# Patient Record
Sex: Female | Born: 1950
Health system: Southern US, Community
[De-identification: ages and names within clinical notes are randomized; demographics above are authoritative.]

## PROBLEM LIST (undated history)

## (undated) DIAGNOSIS — N390 Urinary tract infection, site not specified: Secondary | ICD-10-CM

## (undated) DIAGNOSIS — K219 Gastro-esophageal reflux disease without esophagitis: Secondary | ICD-10-CM

## (undated) DIAGNOSIS — I10 Essential (primary) hypertension: Secondary | ICD-10-CM

## (undated) DIAGNOSIS — A499 Bacterial infection, unspecified: Secondary | ICD-10-CM

## (undated) DIAGNOSIS — B019 Varicella without complication: Secondary | ICD-10-CM

## (undated) DIAGNOSIS — K635 Polyp of colon: Secondary | ICD-10-CM

## (undated) HISTORY — DX: Bacterial infection, unspecified: A49.9

## (undated) HISTORY — PX: TUBAL LIGATION: SHX77

## (undated) HISTORY — PX: CYST REMOVAL NECK: SHX6281

## (undated) HISTORY — DX: Polyp of colon: K63.5

## (undated) HISTORY — DX: Varicella without complication: B01.9

## (undated) HISTORY — DX: Gastro-esophageal reflux disease without esophagitis: K21.9

## (undated) HISTORY — PX: TONSILLECTOMY AND ADENOIDECTOMY: SUR1326

## (undated) HISTORY — PX: CARDIAC CATHETERIZATION: SHX172

## (undated) HISTORY — DX: Bacterial infection, unspecified: N39.0

---

## 1983-06-07 HISTORY — PX: CHOLECYSTECTOMY: SHX55

## 2002-04-09 LAB — HM COLONOSCOPY

## 2004-03-08 ENCOUNTER — Ambulatory Visit: Payer: Self-pay | Admitting: Unknown Physician Specialty

## 2005-03-30 ENCOUNTER — Ambulatory Visit: Payer: Self-pay | Admitting: Unknown Physician Specialty

## 2007-03-15 ENCOUNTER — Ambulatory Visit: Payer: Self-pay | Admitting: Unknown Physician Specialty

## 2008-04-29 ENCOUNTER — Ambulatory Visit: Payer: Self-pay | Admitting: Internal Medicine

## 2008-05-06 ENCOUNTER — Ambulatory Visit: Payer: Self-pay | Admitting: Internal Medicine

## 2008-05-15 ENCOUNTER — Ambulatory Visit: Payer: Self-pay | Admitting: Internal Medicine

## 2008-06-06 ENCOUNTER — Ambulatory Visit: Payer: Self-pay | Admitting: Internal Medicine

## 2009-04-09 LAB — HM PAP SMEAR: HM Pap smear: NORMAL

## 2011-11-10 ENCOUNTER — Ambulatory Visit: Payer: Self-pay | Admitting: Unknown Physician Specialty

## 2011-12-08 LAB — HM MAMMOGRAPHY: HM Mammogram: NORMAL

## 2012-01-07 ENCOUNTER — Other Ambulatory Visit: Payer: Self-pay | Admitting: Internal Medicine

## 2012-04-09 ENCOUNTER — Ambulatory Visit (INDEPENDENT_AMBULATORY_CARE_PROVIDER_SITE_OTHER): Payer: PRIVATE HEALTH INSURANCE | Admitting: Internal Medicine

## 2012-04-09 ENCOUNTER — Encounter: Payer: Self-pay | Admitting: Internal Medicine

## 2012-04-09 VITALS — BP 144/88 | HR 75 | Temp 98.0°F | Ht 69.0 in | Wt 202.2 lb

## 2012-04-09 DIAGNOSIS — Z0000 Encounter for general adult medical examination without abnormal findings: Secondary | ICD-10-CM

## 2012-04-09 DIAGNOSIS — D649 Anemia, unspecified: Secondary | ICD-10-CM

## 2012-04-09 LAB — CBC WITH DIFFERENTIAL/PLATELET
Basophils Absolute: 0 10*3/uL (ref 0.0–0.1)
Eosinophils Absolute: 0.2 10*3/uL (ref 0.0–0.7)
HCT: 34.1 % — ABNORMAL LOW (ref 36.0–46.0)
Hemoglobin: 10.9 g/dL — ABNORMAL LOW (ref 12.0–15.0)
Lymphs Abs: 1.9 10*3/uL (ref 0.7–4.0)
MCHC: 31.9 g/dL (ref 30.0–36.0)
MCV: 83.7 fl (ref 78.0–100.0)
Monocytes Relative: 10.4 % (ref 3.0–12.0)
Neutro Abs: 1.6 10*3/uL (ref 1.4–7.7)
RDW: 13.4 % (ref 11.5–14.6)

## 2012-04-09 LAB — COMPREHENSIVE METABOLIC PANEL
ALT: 25 U/L (ref 0–35)
AST: 27 U/L (ref 0–37)
Alkaline Phosphatase: 79 U/L (ref 39–117)
Sodium: 138 mEq/L (ref 135–145)
Total Bilirubin: 0.5 mg/dL (ref 0.3–1.2)
Total Protein: 8.5 g/dL — ABNORMAL HIGH (ref 6.0–8.3)

## 2012-04-09 LAB — LDL CHOLESTEROL, DIRECT: Direct LDL: 132.2 mg/dL

## 2012-04-09 LAB — LIPID PANEL: HDL: 68.1 mg/dL (ref >39.00)

## 2012-04-09 NOTE — Progress Notes (Signed)
  Subjective:    Patient ID: Cassie Torres, female    DOB: [birth year], 61 y.o.   MRN: [id number]  HPI 61 year old female presents to establish care. She reports that she is generally doing well. She has not seen a physician in approximately 2 years. Her husband passed away unexpectedly 2 years ago and she notes that she has been lost to followup that time. She has not had any major chronic medical problems. She reports normal energy level. She denies any concerns about bowel habits except for occasional constipation for which she takes MiraLax. She denies blood in her stool or black stool. She had a colonoscopy approximately 10 years ago which she reports was normal. She is due for her mammogram. Pap smear is UTD.  No outpatient encounter prescriptions on file as of 04/09/2012.   BP 144/88  Pulse 75  Temp 98 F (36.7 C)  Ht 5\' 9"  (1.753 m)  Wt 202 lb 4 oz (91.74 kg)  BMI 29.87 kg/m2  SpO2 98%  Review of Systems  Constitutional: Negative for fever, chills, appetite change, fatigue and unexpected weight change.  HENT: Negative for ear pain, congestion, sore throat, trouble swallowing, neck pain, voice change and sinus pressure.   Eyes: Negative for visual disturbance.  Respiratory: Negative for cough, shortness of breath, wheezing and stridor.   Cardiovascular: Negative for chest pain, palpitations and leg swelling.  Gastrointestinal: Negative for nausea, vomiting, abdominal pain, diarrhea, constipation, blood in stool, abdominal distention and anal bleeding.  Genitourinary: Negative for dysuria and flank pain.  Musculoskeletal: Negative for myalgias, arthralgias and gait problem.  Skin: Negative for color change and rash.  Neurological: Negative for dizziness and headaches.  Hematological: Negative for adenopathy. Does not bruise/bleed easily.  Psychiatric/Behavioral: Negative for suicidal ideas, sleep disturbance and dysphoric mood. The patient is not nervous/anxious.          Objective:   Physical Exam  Constitutional: She is oriented to person, place, and time. She appears well-developed and well-nourished. No distress.  HENT:  Head: Normocephalic and atraumatic.  Right Ear: External ear normal.  Left Ear: External ear normal.  Nose: Nose normal.  Mouth/Throat: Oropharynx is clear and moist. No oropharyngeal exudate.  Eyes: Conjunctivae normal are normal. Pupils are equal, round, and reactive to light. Right eye exhibits no discharge. Left eye exhibits no discharge. No scleral icterus.  Neck: Normal range of motion. Neck supple. No tracheal deviation present. No thyromegaly present.  Cardiovascular: Normal rate, regular rhythm, normal heart sounds and intact distal pulses.  Exam reveals no gallop and no friction rub.   No murmur heard. Pulmonary/Chest: Effort normal and breath sounds normal. No respiratory distress. She has no wheezes. She has no rales. She exhibits no tenderness.  Abdominal: Soft. Bowel sounds are normal. She exhibits no distension and no mass. There is no tenderness. There is no rebound and no guarding.  Musculoskeletal: Normal range of motion. She exhibits no edema and no tenderness.  Lymphadenopathy:    She has no cervical adenopathy.  Neurological: She is alert and oriented to person, place, and time. No cranial nerve deficit. She exhibits normal muscle tone. Coordination normal.  Skin: Skin is warm and dry. No rash noted. She is not diaphoretic. No erythema. No pallor.  Psychiatric: She has a normal mood and affect. Her behavior is normal. Judgment and thought content normal.          Assessment & Plan:

## 2012-04-09 NOTE — Assessment & Plan Note (Signed)
General medical exam is normal today. Will obtain records on previous evaluation and management. Labs remarkable for mild anemia as noted below. Will obtain records on prior colonoscopy. Followup in one month.

## 2012-04-09 NOTE — Assessment & Plan Note (Signed)
Mild microcytic anemia noted on labs today. Leukocytes and platelets are also slightly low. Will try to obtain records on previous CBC. Will send ferritin to look for iron deficiency. If this is low, will check stool Hemoccult for blood. We'll plan to repeat CBC in one month. Followup one month.

## 2012-07-18 ENCOUNTER — Telehealth: Payer: Self-pay | Admitting: Internal Medicine

## 2012-07-18 ENCOUNTER — Encounter: Payer: Self-pay | Admitting: Adult Health

## 2012-07-18 ENCOUNTER — Ambulatory Visit (INDEPENDENT_AMBULATORY_CARE_PROVIDER_SITE_OTHER): Payer: PRIVATE HEALTH INSURANCE | Admitting: Adult Health

## 2012-07-18 VITALS — BP 110/80 | HR 80 | Temp 98.2°F | Resp 14 | Ht 69.0 in | Wt 203.0 lb

## 2012-07-18 DIAGNOSIS — L02429 Furuncle of limb, unspecified: Secondary | ICD-10-CM

## 2012-07-18 MED ORDER — DOXYCYCLINE HYCLATE 100 MG PO TABS: 100.0000 mg | ORAL_TABLET | Freq: Two times a day (BID) | ORAL | Status: DC

## 2012-07-18 NOTE — Assessment & Plan Note (Addendum)
I&D performed after area was numbed with xylocane 2%. There was very little drainage from area. Culture sent. Patient was started on doxycycline x 10 days. Return for f/u on Friday.

## 2012-07-18 NOTE — Patient Instructions (Addendum)
  Please start your antibiotic today. You will take this twice daily for 10 days.  Please call if you develop fever or chills.  Keep the area clean. You can cover the opening of the boil with gauze to absorb any drainage.  Take tylenol or ibuprofen if you have any pain.  Follow up in the office on Friday.

## 2012-07-18 NOTE — Telephone Encounter (Signed)
Patient Information:  Caller Name: Stepanie  Phone: [phone]  Patient: Cassie Torres, Cassie Torres  Gender: Female  DOB: 1950/06/26  Age: 62 Years  PCP: Orville Govern  Office Follow Up:  Does the office need to follow up with this patient?: No  Instructions For The Office: N/A   Symptoms  Reason For Call & Symptoms: Boil on Left inner thigh since Sun 2/9.  Size of silver dollar, redness growing rapidly, area tender  Reviewed Health History In EMR: Yes  Reviewed Medications In EMR: Yes  Reviewed Allergies In EMR: Yes  Reviewed Surgeries / Procedures: Yes  Date of Onset of Symptoms: 07/15/2012  Treatments Tried: hot soaks, salt water, Boileze  Treatments Tried Worked: No  Guideline(s) Used:  Skin Lesion - Moles or Growths  Disposition Per Guideline:   See Today in Office  Reason For Disposition Reached:   Looks infected (e.g., spreading redness, pus, red streak)  Advice Given:  Call Back If:  Fever or pain occurs  You become worse.  Appointment Scheduled:  07/18/2012 11:00:00 Appointment Scheduled Provider:  Orville Govern

## 2012-07-18 NOTE — Telephone Encounter (Signed)
Just an FYI

## 2012-07-18 NOTE — Progress Notes (Signed)
  Subjective:    Patient ID: Cassie Torres, female    DOB: 1950/08/27, 62 y.o.   MRN: [id number]  HPI  Patient presents with c/o having a "boil" on the inner aspect of her left thigh. Patient reports that initially there were 3 different areas but the others have resolved. She noticed the area on Sunday while bathing. It is red, swollen and painful to touch. She denies drainage, fever or chills.  Review of Systems  Constitutional: Negative for fever and chills.  Skin:       Boil inner left upper thigh. Area is red and painful to touch.    BP 110/80  Pulse 80  Temp(Src) 98.2 F (36.8 C) (Oral)  Resp 14  Ht 5\' 9"  (1.753 m)  Wt 203 lb (92.08 kg)  BMI 29.96 kg/m2  SpO2 99%     Objective:   Physical Exam  Constitutional: She is oriented to person, place, and time. She appears well-developed and well-nourished. No distress.  Neurological: She is alert and oriented to person, place, and time.  Skin: No rash noted. There is erythema.  localized infection in the skin on upper inner aspect of left thigh. Area is firm, hard, and tender. Center forms a head with escar noted. No drainage.         Assessment & Plan:

## 2012-07-20 ENCOUNTER — Ambulatory Visit: Payer: PRIVATE HEALTH INSURANCE | Admitting: Internal Medicine

## 2012-07-21 ENCOUNTER — Other Ambulatory Visit: Payer: Self-pay

## 2012-07-22 LAB — MRSA CULTURE

## 2012-07-26 ENCOUNTER — Ambulatory Visit (INDEPENDENT_AMBULATORY_CARE_PROVIDER_SITE_OTHER): Payer: PRIVATE HEALTH INSURANCE | Admitting: Internal Medicine

## 2012-07-26 ENCOUNTER — Encounter: Payer: Self-pay | Admitting: Internal Medicine

## 2012-07-26 VITALS — BP 138/78 | HR 70 | Temp 98.1°F | Ht 67.75 in | Wt 199.0 lb

## 2012-07-26 DIAGNOSIS — L02429 Furuncle of limb, unspecified: Secondary | ICD-10-CM

## 2012-07-26 DIAGNOSIS — E8809 Other disorders of plasma-protein metabolism, not elsewhere classified: Secondary | ICD-10-CM

## 2012-07-26 DIAGNOSIS — D649 Anemia, unspecified: Secondary | ICD-10-CM

## 2012-07-26 LAB — COMPREHENSIVE METABOLIC PANEL
Albumin: 3.6 g/dL (ref 3.5–5.2)
BUN: 14 mg/dL (ref 6–23)
CO2: 29 mEq/L (ref 19–32)
Calcium: 9.1 mg/dL (ref 8.4–10.5)
Chloride: 104 mEq/L (ref 96–112)
GFR: 111.45 mL/min (ref >60.00)
Glucose, Bld: 73 mg/dL (ref 70–99)
Potassium: 3.7 mEq/L (ref 3.5–5.1)
Sodium: 138 mEq/L (ref 135–145)
Total Protein: 8.5 g/dL — ABNORMAL HIGH (ref 6.0–8.3)

## 2012-07-26 LAB — CBC WITH DIFFERENTIAL/PLATELET
Basophils Relative: 0.8 % (ref 0.0–3.0)
Eosinophils Relative: 6.5 % — ABNORMAL HIGH (ref 0.0–5.0)
Lymphocytes Relative: 47.7 % — ABNORMAL HIGH (ref 12.0–46.0)
MCV: 82.1 fl (ref 78.0–100.0)
Monocytes Absolute: 0.4 10*3/uL (ref 0.1–1.0)
Neutrophils Relative %: 33.5 % — ABNORMAL LOW (ref 43.0–77.0)
Platelets: 216 10*3/uL (ref 150.0–400.0)
RBC: 4.05 Mil/uL (ref 3.87–5.11)
WBC: 3.7 10*3/uL — ABNORMAL LOW (ref 4.5–10.5)

## 2012-07-26 NOTE — Assessment & Plan Note (Signed)
Total protein noted to be elevated on recent labs. Will recheck today. If persistent elevation, would favor sending SIEP and UIEP.

## 2012-07-26 NOTE — Assessment & Plan Note (Signed)
History of microcytic anemia. Will repeat CBC with labs today.

## 2012-07-26 NOTE — Addendum Note (Signed)
Addended by: Ronna Polio A on: 07/26/2012 05:13 PM   Modules accepted: Orders

## 2012-07-26 NOTE — Progress Notes (Signed)
  Subjective:    Patient ID: Cassie Torres, female    DOB: 20-Dec-1950, 62 y.o.   MRN: [id number]  HPI 62 year old female presents for followup after recent episode of abscess on her left thigh. She is status post incision and drainage and treatment with doxycycline. She reports that symptoms have improved. She notes some drainage from the wound which has gradually decreased. She denies any fever or chills. She denies any new concerns today.  Outpatient Encounter Prescriptions as of 07/26/2012  Medication Sig Dispense Refill  . doxycycline (VIBRA-TABS) 100 MG tablet Take 1 tablet (100 mg total) by mouth 2 (two) times daily.  20 tablet  0   No facility-administered encounter medications on file as of 07/26/2012.   BP 138/78  Pulse 70  Temp(Src) 98.1 F (36.7 C) (Oral)  Ht 5' 7.75" (1.721 m)  Wt 199 lb (90.266 kg)  BMI 30.48 kg/m2  SpO2 98%   Review of Systems  Constitutional: Negative for fever, chills, appetite change, fatigue and unexpected weight change.  HENT: Negative for ear pain, congestion, sore throat, trouble swallowing, neck pain, voice change and sinus pressure.   Eyes: Negative for visual disturbance.  Respiratory: Negative for cough, shortness of breath, wheezing and stridor.   Cardiovascular: Negative for chest pain, palpitations and leg swelling.  Gastrointestinal: Negative for nausea, vomiting, abdominal pain, diarrhea, constipation, blood in stool, abdominal distention and anal bleeding.  Genitourinary: Negative for dysuria and flank pain.  Musculoskeletal: Negative for myalgias, arthralgias and gait problem.  Skin: Positive for color change and wound. Negative for rash.  Neurological: Negative for dizziness and headaches.  Hematological: Negative for adenopathy. Does not bruise/bleed easily.  Psychiatric/Behavioral: Negative for suicidal ideas, sleep disturbance and dysphoric mood. The patient is not nervous/anxious.        Objective:   Physical Exam    Constitutional: She is oriented to person, place, and time. She appears well-developed and well-nourished. No distress.  HENT:  Head: Normocephalic and atraumatic.  Right Ear: External ear normal.  Left Ear: External ear normal.  Nose: Nose normal.  Mouth/Throat: Oropharynx is clear and moist. No oropharyngeal exudate.  Eyes: Conjunctivae are normal. Pupils are equal, round, and reactive to light. Right eye exhibits no discharge. Left eye exhibits no discharge. No scleral icterus.  Neck: Normal range of motion. Neck supple. No tracheal deviation present. No thyromegaly present.  Cardiovascular: Normal rate, regular rhythm, normal heart sounds and intact distal pulses.  Exam reveals no gallop and no friction rub.   No murmur heard. Pulmonary/Chest: Effort normal and breath sounds normal. No respiratory distress. She has no wheezes. She has no rales. She exhibits no tenderness.  Musculoskeletal: Normal range of motion. She exhibits no edema and no tenderness.  Lymphadenopathy:    She has no cervical adenopathy.  Neurological: She is alert and oriented to person, place, and time. No cranial nerve deficit. She exhibits normal muscle tone. Coordination normal.  Skin: Skin is warm and dry. No rash noted. She is not diaphoretic. No erythema. No pallor.     Psychiatric: She has a normal mood and affect. Her behavior is normal. Judgment and thought content normal.          Assessment & Plan:

## 2012-07-26 NOTE — Assessment & Plan Note (Signed)
Abscess has nearly completely resolved after incision and drainage and oral doxycycline. Will continue to monitor. Pt will call if any recurrent redness, swelling, or if any fever/chills. Reviewed culture which showed staph aureus which was susceptible to methicillin.  Follow up prn.

## 2012-07-27 ENCOUNTER — Other Ambulatory Visit: Payer: PRIVATE HEALTH INSURANCE

## 2012-08-01 ENCOUNTER — Other Ambulatory Visit: Payer: PRIVATE HEALTH INSURANCE

## 2012-08-14 ENCOUNTER — Telehealth: Payer: Self-pay | Admitting: Internal Medicine

## 2012-08-14 ENCOUNTER — Other Ambulatory Visit (INDEPENDENT_AMBULATORY_CARE_PROVIDER_SITE_OTHER): Payer: PRIVATE HEALTH INSURANCE

## 2012-08-14 DIAGNOSIS — E8809 Other disorders of plasma-protein metabolism, not elsewhere classified: Secondary | ICD-10-CM

## 2012-08-14 DIAGNOSIS — D649 Anemia, unspecified: Secondary | ICD-10-CM

## 2012-08-14 NOTE — Telephone Encounter (Signed)
Please leave msg.  Pt wants to come in this afternoon after 2:00 p.m. To have blood work done for her appt on Thursday.

## 2012-08-14 NOTE — Telephone Encounter (Signed)
Left information on patient voicemail per her request, also asked her to call back to let us know what time she want to come in.

## 2012-08-14 NOTE — Telephone Encounter (Signed)
Will this be ok? Has her labs already been ordered?

## 2012-08-14 NOTE — Telephone Encounter (Signed)
Yes, labs have been ordered

## 2012-08-16 ENCOUNTER — Telehealth: Payer: Self-pay | Admitting: Internal Medicine

## 2012-08-16 ENCOUNTER — Encounter: Payer: Self-pay | Admitting: Internal Medicine

## 2012-08-16 ENCOUNTER — Ambulatory Visit (INDEPENDENT_AMBULATORY_CARE_PROVIDER_SITE_OTHER): Payer: PRIVATE HEALTH INSURANCE | Admitting: Internal Medicine

## 2012-08-16 VITALS — BP 150/90 | HR 73 | Temp 98.1°F | Wt 202.0 lb

## 2012-08-16 DIAGNOSIS — R51 Headache: Secondary | ICD-10-CM | POA: Insufficient documentation

## 2012-08-16 DIAGNOSIS — Z1239 Encounter for other screening for malignant neoplasm of breast: Secondary | ICD-10-CM

## 2012-08-16 DIAGNOSIS — E8809 Other disorders of plasma-protein metabolism, not elsewhere classified: Secondary | ICD-10-CM

## 2012-08-16 DIAGNOSIS — R519 Headache, unspecified: Secondary | ICD-10-CM | POA: Insufficient documentation

## 2012-08-16 DIAGNOSIS — D649 Anemia, unspecified: Secondary | ICD-10-CM

## 2012-08-16 LAB — PROTEIN ELECTROPHORESIS, SERUM
Albumin ELP: 49.9 % — ABNORMAL LOW (ref 55.8–66.1)
Alpha-1-Globulin: 3.8 % (ref 2.9–4.9)
Beta 2: 4.9 % (ref 3.2–6.5)
Beta Globulin: 5 % (ref 4.7–7.2)
Total Protein, Serum Electrophoresis: 8.4 g/dL — ABNORMAL HIGH (ref 6.0–8.3)

## 2012-08-16 NOTE — Assessment & Plan Note (Signed)
Symptoms of headache most consistent with tension headache, possibly exacerbated by recent sinus congestion. Will use ibuprofen and tylenol prn. Discussed trying Fiorcet, however pt prefers to hold off for now. Follow up prn.

## 2012-08-16 NOTE — Telephone Encounter (Signed)
I received notes from [facility] from Dr. Sherrlyn Hock. When pt was seen by Dr. Sherrlyn Hock before, she did not have anemia. Given that this is a new finding, in addition to the elevated protein, I would like for her to follow up with him. He may want to run a few more tests.

## 2012-08-16 NOTE — Assessment & Plan Note (Signed)
Elevated Total protein 8.5.  SPEP was normal with no M-Spike. Pt reports she was evaluated for this in past by oncology.  Will request records on evaluation. Plan to repeat CMP with labs with physical in 02/2013.

## 2012-08-16 NOTE — Assessment & Plan Note (Signed)
Stable anemia with hgb 10.7. Pt reports previous evaluation by hematology was unremarkable. Will request results on previous evaluation. Plan to repeat CBC with labs 02/2013.

## 2012-08-16 NOTE — Progress Notes (Signed)
Subjective:    Patient ID: Cassie Torres, female    DOB: 1951/02/03, 62 y.o.   MRN: [id number]  HPI 62 year old female presents for followup after recent cellulitis and abscess on her thighs. She reports that symptoms of cellulitis has completely resolved. She denies any recent fever or chills. She denies any other skin lesions.  Over the last couple of days she has had mild headache described as aching on the right side of her head behind her eye. There is no associated no photophobia. No nausea or vomiting. Patient reports these kind of headache typical for her. She generally takes Tylenol or ibuprofen with minimal improvement. Headache typically resolve after a couple of days.  She also recently had labs done which showed elevated blood protein levels. 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 She reports she was evaluated for this by hematology in the past. She reports she was told this was a benign condition and no followup was scheduled.  Outpatient Encounter Prescriptions as of 08/16/2012  Medication Sig Dispense Refill  . doxycycline (VIBRA-TABS) 100 MG tablet Take 1 tablet (100 mg total) by mouth 2 (two) times daily.  20 tablet  0   No facility-administered encounter medications on file as of 08/16/2012.   BP 150/90  Pulse 73  Temp(Src) 98.1 F (36.7 C) (Oral)  Wt 202 lb (91.627 kg)  BMI 30.94 kg/m2  SpO2 99%  Review of Systems  Constitutional: Negative for fever, chills, appetite change, fatigue and unexpected weight change.  HENT: Negative for ear pain, congestion, sore throat, trouble swallowing, neck pain, voice change and sinus pressure.   Eyes: Negative for visual disturbance.  Respiratory: Negative for cough, shortness of breath, wheezing and stridor.   Cardiovascular: Negative for chest pain, palpitations and leg swelling.  Gastrointestinal: Negative for nausea, vomiting, abdominal pain, diarrhea, constipation, blood in stool, abdominal distention and anal  bleeding.  Genitourinary: Negative for dysuria and flank pain.  Musculoskeletal: Negative for myalgias, arthralgias and gait problem.  Skin: Negative for color change and rash.  Neurological: Positive for headaches. Negative for dizziness.  Hematological: Negative for adenopathy. Does not bruise/bleed easily.  Psychiatric/Behavioral: Negative for suicidal ideas, sleep disturbance and dysphoric mood. The patient is not nervous/anxious.        Objective:   Physical Exam  Constitutional: She is oriented to person, place, and time. She appears well-developed and well-nourished. No distress.  HENT:  Head: Normocephalic and atraumatic.  Right Ear: External ear normal.  Left Ear: External ear normal.  Nose: Nose normal.  Mouth/Throat: Oropharynx is clear and moist. No oropharyngeal exudate.  Eyes: Conjunctivae are normal. Pupils are equal, round, and reactive to light. Right eye exhibits no discharge. Left eye exhibits no discharge. No scleral icterus.  Neck: Normal range of motion. Neck supple. No tracheal deviation present. No thyromegaly present.  Cardiovascular: Normal rate, regular rhythm, normal heart sounds and intact distal pulses.  Exam reveals no gallop and no friction rub.   No murmur heard. Pulmonary/Chest: Effort normal and breath sounds normal. No respiratory distress. She has no wheezes. She has no rales. She exhibits no tenderness.  Musculoskeletal: Normal range of motion. She exhibits no edema and no tenderness.  Lymphadenopathy:    She has no cervical adenopathy.  Neurological: She is alert and oriented to person, place, and time. No cranial nerve deficit. She exhibits normal muscle tone. Coordination normal.  Skin: Skin is warm and dry. No rash noted. She is not diaphoretic. No erythema. No pallor.  Psychiatric: She has a normal  mood and affect. Her behavior is normal. Judgment and thought content normal.          Assessment & Plan:

## 2012-08-17 ENCOUNTER — Telehealth: Payer: Self-pay | Admitting: Emergency Medicine

## 2012-08-17 LAB — PROTEIN ELECTROPHORESIS, URINE REFLEX
Albumin: 50.1 %
Alpha-1-Globulin, U: 20.7 %
Beta Globulin, U: 6.1 %

## 2012-08-17 NOTE — Telephone Encounter (Signed)
Patient informed and she will make an appointment with Dr. Sherrlyn Hock.

## 2012-08-20 ENCOUNTER — Ambulatory Visit: Payer: Self-pay | Admitting: Internal Medicine

## 2012-08-29 ENCOUNTER — Ambulatory Visit: Payer: Self-pay | Admitting: Internal Medicine

## 2012-08-29 LAB — CBC CANCER CENTER
HCT: 36.4 % (ref 35.0–47.0)
HGB: 11.9 g/dL — ABNORMAL LOW (ref 12.0–16.0)
Lymphocytes: 50 %
MCH: 26.8 pg (ref 26.0–34.0)
MCHC: 32.6 g/dL (ref 32.0–36.0)
MCV: 82 fL (ref 80–100)
Monocytes: 5 %
RBC: 4.43 10*6/uL (ref 3.80–5.20)
WBC: 3.6 x10 3/mm (ref 3.6–11.0)

## 2012-08-29 LAB — IRON AND TIBC
Iron Bind.Cap.(Total): 367 ug/dL (ref 250–450)
Iron Saturation: 17 %
Iron: 61 ug/dL (ref 50–170)
Unbound Iron-Bind.Cap.: 306 ug/dL

## 2012-08-29 LAB — SEDIMENTATION RATE: Erythrocyte Sed Rate: 24 mm/hr (ref 0–30)

## 2012-08-29 LAB — RETICULOCYTES: Absolute Retic Count: 0.0415 10*6/uL

## 2012-08-29 LAB — LACTATE DEHYDROGENASE: LDH: 198 U/L (ref 81–246)

## 2012-08-29 LAB — FOLATE: Folic Acid: 19.1 ng/mL (ref 3.1–100.0)

## 2012-08-30 LAB — PROT IMMUNOELECTROPHORES(ARMC)

## 2012-09-04 ENCOUNTER — Ambulatory Visit: Payer: Self-pay | Admitting: Internal Medicine

## 2012-10-04 ENCOUNTER — Ambulatory Visit: Payer: Self-pay | Admitting: Internal Medicine

## 2012-11-22 ENCOUNTER — Ambulatory Visit (INDEPENDENT_AMBULATORY_CARE_PROVIDER_SITE_OTHER): Payer: PRIVATE HEALTH INSURANCE | Admitting: Internal Medicine

## 2012-11-22 ENCOUNTER — Encounter: Payer: Self-pay | Admitting: Internal Medicine

## 2012-11-22 ENCOUNTER — Telehealth: Payer: Self-pay | Admitting: Internal Medicine

## 2012-11-22 VITALS — BP 138/78 | HR 75 | Temp 97.6°F | Wt 192.0 lb

## 2012-11-22 DIAGNOSIS — N390 Urinary tract infection, site not specified: Secondary | ICD-10-CM | POA: Insufficient documentation

## 2012-11-22 LAB — POCT URINALYSIS DIPSTICK
Glucose, UA: NEGATIVE
Nitrite, UA: NEGATIVE
Urobilinogen, UA: 1

## 2012-11-22 MED ORDER — SULFAMETHOXAZOLE-TRIMETHOPRIM 800-160 MG PO TABS: 1.0000 | ORAL_TABLET | Freq: Two times a day (BID) | ORAL | Status: DC

## 2012-11-22 NOTE — Telephone Encounter (Signed)
Patient Information:  Caller Name: Dariona  Phone: [phone]  Patient: Cassie Torres, Cassie Torres  Gender: Female  DOB: Feb 04, 1951  Age: 62 Years  PCP: Ronna Polio (Adults only)  Office Follow Up:  Does the office need to follow up with this patient?: No  Instructions For The Office: N/A  RN Note:  May use otc urinary analgesic as directed.  Symptoms  Reason For Call & Symptoms: Called to request antibiotics for UTI.  Reports mild dysuria with frequency.  Denies hematuria.  Reviewed Health History In EMR: Yes  Reviewed Medications In EMR: Yes  Reviewed Allergies In EMR: Yes  Reviewed Surgeries / Procedures: Yes  Date of Onset of Symptoms: 11/20/2012  Treatments Tried: Drinking cranberry juice, garlic capsules,  Ibuprofen  Treatments Tried Worked: Yes  Guideline(s) Used:  Urination Pain - Female  Disposition Per Guideline:   See Today in Office  Reason For Disposition Reached:   Age > 50 years  Advice Given:  Fluids:   Drink extra fluids. Drink 8-10 glasses of liquids a day (Reason: to produce a dilute, non-irritating urine).  Cranberry Juice:   Some people think that drinking cranberry juice may help in fighting urinary tract infections. However, there is no good research that has ever proved this.  Dosage Cranberry Juice Cocktail: 8 oz (240 ml) twice a day.  Dosage 100% Cranberry Juice: 1 oz (30 ml) twice a day.  Caution: Do not drink more than 16 oz (480 ml). Here is the reason: too much cranberry juice can also be irritating to the bladder.  Call Back If:  You become worse.  Patient Will Follow Care Advice:  YES  Appointment Scheduled:  11/22/2012 14:00:00 Appointment Scheduled Provider:  Ronna Polio (Adults only)

## 2012-11-22 NOTE — Progress Notes (Signed)
  Subjective:    Patient ID: Cassie Torres, female    DOB: 1950/08/06, 62 y.o.   MRN: [id number]  HPI 62YO female presents for acute visit c/o 2 days of urinary urgency, frequency and suprapubic pain. No fever, chills, or flank pain. Taking cranberry juice with no improvement.   Outpatient Encounter Prescriptions as of 11/22/2012  Medication Sig Dispense Refill   No facility-administered encounter medications on file as of 11/22/2012.   BP 138/78  Pulse 75  Temp(Src) 97.6 F (36.4 C) (Oral)  Wt 192 lb (87.091 kg)  BMI 29.4 kg/m2  SpO2 98%    Review of Systems  Constitutional: Negative for fever, chills and fatigue.  Gastrointestinal: Negative for nausea, vomiting, abdominal pain, diarrhea, constipation and rectal pain.  Genitourinary: Positive for dysuria, urgency and frequency. Negative for hematuria, flank pain, decreased urine volume, vaginal bleeding, vaginal discharge, difficulty urinating, vaginal pain and pelvic pain.       Objective:   Physical Exam  Constitutional: She is oriented to person, place, and time. She appears well-developed and well-nourished. No distress.  HENT:  Head: Normocephalic and atraumatic.  Right Ear: External ear normal.  Left Ear: External ear normal.  Nose: Nose normal.  Mouth/Throat: Oropharynx is clear and moist. No oropharyngeal exudate.  Eyes: Conjunctivae are normal. Pupils are equal, round, and reactive to light. Right eye exhibits no discharge. Left eye exhibits no discharge. No scleral icterus.  Neck: Normal range of motion. Neck supple. No tracheal deviation present. No thyromegaly present.  Cardiovascular: Normal rate, regular rhythm, normal heart sounds and intact distal pulses.  Exam reveals no gallop and no friction rub.   No murmur heard. Pulmonary/Chest: Effort normal and breath sounds normal. No accessory muscle usage. Not tachypneic. No respiratory distress. She has no decreased breath sounds. She has no wheezes. She has no  rhonchi. She has no rales. She exhibits no tenderness.  Abdominal: There is no tenderness (no CVA tenderness).  Musculoskeletal: Normal range of motion. She exhibits no edema and no tenderness.  Lymphadenopathy:    She has no cervical adenopathy.  Neurological: She is alert and oriented to person, place, and time. No cranial nerve deficit. She exhibits normal muscle tone. Coordination normal.  Skin: Skin is warm and dry. No rash noted. She is not diaphoretic. No erythema. No pallor.  Psychiatric: She has a normal mood and affect. Her behavior is normal. Judgment and thought content normal.          Assessment & Plan:

## 2012-11-22 NOTE — Assessment & Plan Note (Signed)
Symptoms and urinalysis consistent with UTI. Will send urine for culture. Will treat with Bactrim DS po bid x 7 days. Pt will call if symptoms are not improve improving over next 24 hr. Follow up prn.

## 2012-11-22 NOTE — Addendum Note (Signed)
Addended by: Theola Sequin on: 11/22/2012 01:28 PM   Modules accepted: Orders

## 2012-11-22 NOTE — Addendum Note (Signed)
Addended by: Cristol Engdahl, Benedetta D on: 11/22/2012 01:32 PM   Modules accepted: Orders  

## 2012-11-22 NOTE — Addendum Note (Signed)
Addended by: Montine Circle D on: 11/22/2012 01:32 PM   Modules accepted: Orders

## 2012-11-22 NOTE — Telephone Encounter (Signed)
Fwd to Dr. Walker 

## 2012-11-22 NOTE — Addendum Note (Signed)
Addended by: Theola Sequin on: 11/22/2012 01:25 PM   Modules accepted: Orders

## 2012-11-24 LAB — URINE CULTURE: Colony Count: >=100000

## 2012-12-04 ENCOUNTER — Encounter: Payer: Self-pay | Admitting: Adult Health

## 2012-12-04 ENCOUNTER — Ambulatory Visit (INDEPENDENT_AMBULATORY_CARE_PROVIDER_SITE_OTHER): Payer: PRIVATE HEALTH INSURANCE | Admitting: Adult Health

## 2012-12-04 VITALS — BP 126/72 | HR 70 | Temp 97.9°F | Resp 12 | Wt 190.5 lb

## 2012-12-04 DIAGNOSIS — B379 Candidiasis, unspecified: Secondary | ICD-10-CM

## 2012-12-04 MED ORDER — NYSTATIN-TRIAMCINOLONE 100000-0.1 UNIT/GM-% EX OINT: TOPICAL_OINTMENT | Freq: Two times a day (BID) | CUTANEOUS | Status: DC

## 2012-12-04 MED ORDER — FLUCONAZOLE 150 MG PO TABS: 150.0000 mg | ORAL_TABLET | Freq: Once | ORAL | Status: DC

## 2012-12-04 NOTE — Assessment & Plan Note (Signed)
Start Diflucan. Mycolog ointment to external genitalia bid. RTC if no improvement within 3-4 days.

## 2012-12-04 NOTE — Progress Notes (Signed)
  Subjective:    Patient ID: Cassie Torres, female    DOB: 1951/01/04, 62 y.o.   MRN: [id number]  HPI  Recently completed antibiotic approximately 2 weeks ago. She is here today with c/o yeast infection. Symptoms of white discharge, itching and burning  Review of Systems Positive for discharge from vagina white, non-odorous, itching, burning    Objective:   Physical Exam  Constitutional: She is oriented to person, place, and time.  Neurological: She is alert and oriented to person, place, and time.  Skin: Skin is warm and dry.  Psychiatric: She has a normal mood and affect. Thought content normal.          Assessment & Plan:

## 2013-02-14 ENCOUNTER — Encounter: Payer: PRIVATE HEALTH INSURANCE | Admitting: Internal Medicine

## 2013-02-15 ENCOUNTER — Encounter: Payer: Self-pay | Admitting: Internal Medicine

## 2013-02-15 ENCOUNTER — Ambulatory Visit (INDEPENDENT_AMBULATORY_CARE_PROVIDER_SITE_OTHER): Payer: PRIVATE HEALTH INSURANCE | Admitting: Internal Medicine

## 2013-02-15 VITALS — BP 150/88 | HR 74 | Temp 98.0°F | Ht 67.5 in | Wt 193.0 lb

## 2013-02-15 DIAGNOSIS — Z0000 Encounter for general adult medical examination without abnormal findings: Secondary | ICD-10-CM

## 2013-02-15 DIAGNOSIS — R030 Elevated blood-pressure reading, without diagnosis of hypertension: Secondary | ICD-10-CM

## 2013-02-15 DIAGNOSIS — B373 Candidiasis of vulva and vagina: Secondary | ICD-10-CM

## 2013-02-15 DIAGNOSIS — Z1211 Encounter for screening for malignant neoplasm of colon: Secondary | ICD-10-CM | POA: Insufficient documentation

## 2013-02-15 LAB — POCT URINALYSIS DIPSTICK
Blood, UA: NEGATIVE
Glucose, UA: NEGATIVE
Ketones, UA: NEGATIVE
Spec Grav, UA: 1.02
Urobilinogen, UA: 1

## 2013-02-15 NOTE — Assessment & Plan Note (Addendum)
General medical exam normal today including breast and pelvic exam. PAP is pending. Mammogram ordered. Colonoscopy ordered. Pt declines flu vaccine. Will check labs including CBC, CMP, lipids, Vit D, TSH, urine microalbumin.

## 2013-02-15 NOTE — Progress Notes (Signed)
Subjective:    Patient ID: Cassie Torres, female    DOB: Jul 21, 1950, 62 y.o.   MRN: [id number]  HPI 61YO female presents for general medical exam. Feeling well. No concerns today. Tries to follow healthy diet and get regular physical activity. Due for mammogram, colonoscopy.   Outpatient Encounter Prescriptions as of 02/15/2013  Medication Sig Dispense Refill  . Multiple Vitamin (MULTIVITAMIN) tablet Take 1 tablet by mouth daily.       No facility-administered encounter medications on file as of 02/15/2013.   BP 150/88  Pulse 74  Temp(Src) 98 F (36.7 C) (Oral)  Ht 5' 7.5" (1.715 m)  Wt 193 lb (87.544 kg)  BMI 29.76 kg/m2  SpO2 99%  Review of Systems  Constitutional: Negative for fever, chills, appetite change, fatigue and unexpected weight change.  HENT: Negative for ear pain, congestion, sore throat, trouble swallowing, neck pain, voice change and sinus pressure.   Eyes: Negative for visual disturbance.  Respiratory: Negative for cough, shortness of breath, wheezing and stridor.   Cardiovascular: Negative for chest pain, palpitations and leg swelling.  Gastrointestinal: Negative for nausea, vomiting, abdominal pain, diarrhea, constipation, blood in stool, abdominal distention and anal bleeding.  Genitourinary: Negative for dysuria and flank pain.  Musculoskeletal: Negative for myalgias, arthralgias and gait problem.  Skin: Negative for color change and rash.  Neurological: Negative for dizziness and headaches.  Hematological: Negative for adenopathy. Does not bruise/bleed easily.  Psychiatric/Behavioral: Negative for suicidal ideas, sleep disturbance and dysphoric mood. The patient is not nervous/anxious.        Objective:   Physical Exam  Constitutional: She is oriented to person, place, and time. She appears well-developed and well-nourished. No distress.  HENT:  Head: Normocephalic and atraumatic.  Right Ear: External ear normal.  Left Ear: External ear normal.   Nose: Nose normal.  Mouth/Throat: Oropharynx is clear and moist. No oropharyngeal exudate.  Eyes: Conjunctivae are normal. Pupils are equal, round, and reactive to light. Right eye exhibits no discharge. Left eye exhibits no discharge. No scleral icterus.  Neck: Normal range of motion. Neck supple. No tracheal deviation present. No thyromegaly present.  Cardiovascular: Normal rate, regular rhythm, normal heart sounds and intact distal pulses.  Exam reveals no gallop and no friction rub.   No murmur heard. Pulmonary/Chest: Effort normal and breath sounds normal. No accessory muscle usage. Not tachypneic. No respiratory distress. She has no decreased breath sounds. She has no wheezes. She has no rhonchi. She has no rales. She exhibits no tenderness.  Abdominal: Soft. Bowel sounds are normal. She exhibits no distension and no mass. There is no tenderness. There is no rebound and no guarding.  Genitourinary: Rectum normal, vagina normal and uterus normal. No breast swelling, tenderness, discharge or bleeding. Pelvic exam was performed with patient supine. There is no rash, tenderness or lesion on the right labia. There is no rash, tenderness or lesion on the left labia. Uterus is not enlarged and not tender. Cervix exhibits no motion tenderness, no discharge and no friability. Right adnexum displays no mass, no tenderness and no fullness. Left adnexum displays no mass, no tenderness and no fullness. No erythema or tenderness around the vagina. No vaginal discharge found.  Musculoskeletal: Normal range of motion. She exhibits no edema and no tenderness.  Lymphadenopathy:    She has no cervical adenopathy.  Neurological: She is alert and oriented to person, place, and time. No cranial nerve deficit. She exhibits normal muscle tone. Coordination normal.  Skin: Skin is warm and dry.  No rash noted. She is not diaphoretic. No erythema. No pallor.  Psychiatric: She has a normal mood and affect. Her behavior is  normal. Judgment and thought content normal.          Assessment & Plan:

## 2013-02-15 NOTE — Assessment & Plan Note (Signed)
BP slightly elevated today, however has been well controlled at home. Will have pt monitor and call if BP consistently >140/90.

## 2013-02-17 DIAGNOSIS — B373 Candidiasis of vulva and vagina: Secondary | ICD-10-CM | POA: Insufficient documentation

## 2013-02-17 LAB — URINE CULTURE

## 2013-02-17 MED ORDER — FLUCONAZOLE 150 MG PO TABS: 150.0000 mg | ORAL_TABLET | Freq: Every day | ORAL | Status: AC

## 2013-02-17 NOTE — Addendum Note (Signed)
Addended by: Ronna Polio A on: 02/17/2013 03:11 PM   Modules accepted: Orders

## 2013-02-17 NOTE — Assessment & Plan Note (Signed)
White discharge consistent with yeast seen on vaginal exam. Urine culture pos for yeast. No symptoms of cystitis or pyelonephritis. Will treat as vaginal yeast infection with Diflucan.

## 2013-02-18 ENCOUNTER — Other Ambulatory Visit (HOSPITAL_COMMUNITY)
Admission: RE | Admit: 2013-02-18 | Discharge: 2013-02-18 | Disposition: A | Discharge status: Home / Self Care | Payer: PRIVATE HEALTH INSURANCE | Source: Ambulatory Visit | Attending: Internal Medicine | Admitting: Internal Medicine

## 2013-02-18 DIAGNOSIS — Z01419 Encounter for gynecological examination (general) (routine) without abnormal findings: Secondary | ICD-10-CM | POA: Insufficient documentation

## 2013-02-18 DIAGNOSIS — Z1151 Encounter for screening for human papillomavirus (HPV): Secondary | ICD-10-CM | POA: Insufficient documentation

## 2013-02-18 NOTE — Addendum Note (Signed)
Addended by: Montine Circle D on: 02/18/2013 10:20 AM   Modules accepted: Orders

## 2013-03-26 ENCOUNTER — Encounter: Payer: Self-pay | Admitting: Emergency Medicine

## 2013-04-11 ENCOUNTER — Other Ambulatory Visit: Payer: Self-pay

## 2013-04-25 ENCOUNTER — Encounter: Payer: PRIVATE HEALTH INSURANCE | Admitting: Internal Medicine

## 2016-10-11 ENCOUNTER — Other Ambulatory Visit: Payer: Self-pay | Admitting: Physician Assistant

## 2016-10-11 DIAGNOSIS — Z1231 Encounter for screening mammogram for malignant neoplasm of breast: Secondary | ICD-10-CM

## 2016-11-01 ENCOUNTER — Ambulatory Visit
Admission: RE | Admit: 2016-11-01 | Discharge: 2016-11-01 | Disposition: A | Discharge status: Home / Self Care | Payer: BLUE CROSS/BLUE SHIELD | Source: Ambulatory Visit | Attending: Physician Assistant | Admitting: Physician Assistant

## 2016-11-01 DIAGNOSIS — Z1231 Encounter for screening mammogram for malignant neoplasm of breast: Secondary | ICD-10-CM | POA: Diagnosis not present

## 2017-03-05 ENCOUNTER — Emergency Department: Payer: PPO

## 2017-03-05 ENCOUNTER — Emergency Department
Admission: EM | Admit: 2017-03-05 | Discharge: 2017-03-05 | Disposition: A | Discharge status: Home / Self Care | Payer: PPO | Attending: Emergency Medicine | Admitting: Emergency Medicine

## 2017-03-05 ENCOUNTER — Encounter: Payer: Self-pay | Admitting: Emergency Medicine

## 2017-03-05 DIAGNOSIS — H532 Diplopia: Secondary | ICD-10-CM | POA: Diagnosis not present

## 2017-03-05 DIAGNOSIS — H539 Unspecified visual disturbance: Secondary | ICD-10-CM | POA: Diagnosis not present

## 2017-03-05 DIAGNOSIS — R42 Dizziness and giddiness: Secondary | ICD-10-CM | POA: Diagnosis not present

## 2017-03-05 DIAGNOSIS — H538 Other visual disturbances: Secondary | ICD-10-CM | POA: Insufficient documentation

## 2017-03-05 LAB — COMPREHENSIVE METABOLIC PANEL
ALT: 23 U/L (ref 14–54)
AST: 27 U/L (ref 15–41)
Albumin: 3.7 g/dL (ref 3.5–5.0)
Alkaline Phosphatase: 108 U/L (ref 38–126)
Anion gap: 8 (ref 5–15)
BILIRUBIN TOTAL: 0.4 mg/dL (ref 0.3–1.2)
BUN: 15 mg/dL (ref 6–20)
CO2: 27 mmol/L (ref 22–32)
CREATININE: 0.78 mg/dL (ref 0.44–1.00)
Calcium: 9.3 mg/dL (ref 8.9–10.3)
Chloride: 102 mmol/L (ref 101–111)
GFR calc Af Amer: >60 mL/min (ref >60)
Glucose, Bld: 99 mg/dL (ref 65–99)
Potassium: 3.7 mmol/L (ref 3.5–5.1)
Sodium: 137 mmol/L (ref 135–145)
TOTAL PROTEIN: 8.8 g/dL — AB (ref 6.5–8.1)

## 2017-03-05 LAB — CBC
HEMATOCRIT: 36.5 % (ref 35.0–47.0)
Hemoglobin: 12 g/dL (ref 12.0–16.0)
MCH: 26.7 pg (ref 26.0–34.0)
MCHC: 32.8 g/dL (ref 32.0–36.0)
MCV: 81.4 fL (ref 80.0–100.0)
Platelets: 180 10*3/uL (ref 150–440)
RBC: 4.49 MIL/uL (ref 3.80–5.20)
RDW: 13.5 % (ref 11.5–14.5)
WBC: 4.1 10*3/uL (ref 3.6–11.0)

## 2017-03-05 LAB — TROPONIN I

## 2017-03-05 MED ORDER — MECLIZINE HCL 25 MG PO TABS: 25.0000 mg | ORAL_TABLET | Freq: Three times a day (TID) | ORAL | 0 refills | Status: AC | PRN

## 2017-03-05 MED ORDER — GADOBENATE DIMEGLUMINE 529 MG/ML IV SOLN
18.0000 mL | Freq: Once | INTRAVENOUS | Status: AC | PRN
  Administered 2017-03-05: 18 mL via INTRAVENOUS

## 2017-03-05 NOTE — Discharge Instructions (Signed)
please follow-up with your eye doctor by calling tomorrow to arrange the next available appointment. Please take your medication as needed for any dizziness episodes. Return to the emergency department for any worsening headache, confusion, slurred speech, weakness or numbness of any arm or leg, worsening visual disturbance, or any other symptom personally concerning to yourself.

## 2017-03-05 NOTE — ED Triage Notes (Signed)
Pt to ED via POV, Pt ambulatory to triage without difficulty c/o dizziness and visual changes that started on Friday night around 2000 or 2100. Pt states that when she looks down she feels like everything is slanted and that her vision feels "foggy". Pt states that she feels like she is off balance and that she has pressure behind her right eye. No facial droop noted, Grips equal bilaterally.

## 2017-03-05 NOTE — ED Notes (Signed)
Mri tech speaking with pt

## 2017-03-05 NOTE — ED Provider Notes (Signed)
[facility] Emergency Department Provider Note  Time seen: 10:15 AM  I have reviewed the triage vital signs and the nursing notes.   HISTORY  Chief Complaint Dizziness    HPI Cassie Torres is a 66 y.o. female With a past medical history of gastric reflux presents to the emergency department for visual changes. According to the patient for the past 2 days she has been experiencing a visual disturbance in the left eye. She states when she looks to her far periphery she sees double and when she is looking straight ahead everything appears to be slanted only in the left eye. If she closes her left eye her vision returns to normal. She also believes is somewhat returns to normal when she closes her right eye. Patient denies any confusion, slurred speech, weakness or numbness. Does state mild headache behind her right eye. No history or family history of multiple sclerosis. No history of stroke. Describes her headache as mild dull pain.  Past Medical History:  Diagnosis Date  . Chicken pox   . Colon polyps    benign  . GERD (gastroesophageal reflux disease)   . Urinary tract bacterial infections     Patient Active Problem List   Diagnosis Date Noted  . Candida vaginitis 02/17/2013  . Routine general medical examination at a health care facility 02/15/2013  . Special screening for malignant neoplasms, colon 02/15/2013  . Elevated BP 02/15/2013  . Screening for breast cancer 08/16/2012  . Elevated blood protein 07/26/2012  . Anemia 04/09/2012    Past Surgical History:  Procedure Laterality Date  . CHOLECYSTECTOMY  1985  . CYST REMOVAL NECK     Dr. Phillip Heal  . [place]    . TUBAL LIGATION    . VAGINAL DELIVERY     2    Prior to Admission medications   Medication Sig Start Date End Date Taking? Authorizing Provider  fluconazole (DIFLUCAN) 150 MG tablet Take 1 tablet (150 mg total) by mouth daily. 02/17/13   Jackolyn Confer, MD   Multiple Vitamin (MULTIVITAMIN) tablet Take 1 tablet by mouth daily.    [provider]    Allergies  Allergen Reactions  . Avelox [Moxifloxacin Hcl In Nacl] Anaphylaxis    Family History  Problem Relation Age of Onset  . Arthritis Mother   . Hypertension Mother   . Cancer Sister        Breast  . Breast cancer Sister 1    Social History Social History  Substance Use Topics  . Smoking status: Never Smoker  . Smokeless tobacco: Never Used  . Alcohol use No    Review of Systems Constitutional: Negative for fever. Eyes: -vision when looking to her periphery out of her left eye with slanted vision in her left eye. Cardiovascular: Negative for chest pain. Respiratory: Negative for shortness of breath. Gastrointestinal: Negative for abdominal pain Neurological: mild right-sided headache, no weakness or numbness. All other ROS negative  ____________________________________________   PHYSICAL EXAM:  VITAL SIGNS: ED Triage Vitals [03/05/17 0929]  Enc Vitals Group     BP (!) 175/82     Pulse Rate 80     Resp 16     Temp 97.8 F (36.6 C)     Temp Source Oral     SpO2 99 %     Weight 192 lb (87.1 kg)     Height 5\' 7"  (1.702 m)     Head Circumference      Peak  Flow      Pain Score      Pain Loc      Pain Edu?      Excl. in [place]?     Constitutional: Alert and oriented. Well appearing and in no distress. Eyes: Normal exam ENT   Head: Normocephalic and atraumatic   Mouth/Throat: Mucous membranes are moist. Cardiovascular: Normal rate, regular rhythm. No murmur Respiratory: Normal respiratory effort without tachypnea nor retractions. Breath sounds are clear Gastrointestinal: Soft and nontender. No distention.  Musculoskeletal: Nontender with normal range of motion in all extremities.  Neurologic:  Normal speech and language. No gross focal neurologic deficits. Extraocular muscles are intact, PERRL, 5/5 motor in all extremities, sensation intact and  equal in all extremities, no pronator drift, no lower extremity drift. Cranial nerves intact. Skin:  Skin is warm, dry and intact.  Psychiatric: Mood and affect are normal. Speech and behavior are normal.   ____________________________________________  RADIOLOGY  MRI showed no acute abnormality.  ____________________________________________   INITIAL IMPRESSION / ASSESSMENT AND PLAN / ED COURSE  Pertinent labs & imaging results that were available during my care of the patient were reviewed by me and considered in my medical decision making (see chart for details).  patient presents to the emergency department with 2 days of visual disturbance mostly in the left eye. Differential this time would include CVA, extraocular muscle palsy, MS. We will check labs and perform an MRI of the brain and orbits with and without contrast. Overall the patient appears very well, states her symptoms have been present for 2 days and denies any acute worsening.  MRI of the brain and orbits are normal. it is not entirely clear what is causing the patient's symptoms. We will have her follow-up with her eye doctor. We'll prescribe meclizine for the light/mild dizziness episodes the patient experiences. Patient agreeable to this plan of care.  ____________________________________________   FINAL CLINICAL IMPRESSION(S) / ED DIAGNOSES  visual disturbance dizziness   Harvest Dark, MD 03/05/17 1425

## 2017-03-05 NOTE — ED Notes (Signed)
Pt changed and then taken to [facility]

## 2017-03-06 DIAGNOSIS — H04129 Dry eye syndrome of unspecified lacrimal gland: Secondary | ICD-10-CM | POA: Diagnosis not present

## 2017-03-06 DIAGNOSIS — H353132 Nonexudative age-related macular degeneration, bilateral, intermediate dry stage: Secondary | ICD-10-CM | POA: Diagnosis not present

## 2017-03-06 DIAGNOSIS — H26063 Combined forms of infantile and juvenile cataract, bilateral: Secondary | ICD-10-CM | POA: Diagnosis not present

## 2017-03-06 DIAGNOSIS — H5310 Unspecified subjective visual disturbances: Secondary | ICD-10-CM | POA: Diagnosis not present

## 2017-03-07 NOTE — Progress Notes (Signed)
[facility] Note  03/08/2017     CHIEF COMPLAINT Patient presents for Retina Evaluation   HISTORY OF PRESENT ILLNESS: Cassie Torres is a 66 y.o. female who presents to the clinic today for:   HPI    Retina Evaluation  In both eyes.  This started 1 week ago.  Associated Symptoms Pain, Flashes, Floaters and Distortion.  Negative for Blind Spot, Glare, Shoulder/Hip pain, Redness, Scalp Tenderness, Weight Loss, Photophobia, Fatigue, Jaw Claudication, Trauma and Fever (headache).  Context:  distance vision, mid-range vision and near vision.  Treatments tried include no treatments.  I, the attending physician,  performed the HPI with the patient and updated documentation appropriately.        Comments  Pt presents on the referral of Dr. Orson Gear for concern of mac degen OU; Pt states that [place] is blurred, pt reports that she is having a type of headache that she feels is associated with OU; PT reports floaters, flashes and wavy VA; Pt reports using AT OU prn, and reports taking AREDS 2 po BID;      Last edited by Bernarda Caffey, MD on 03/08/2017 10:56 AM. (History)      Referring physician: Dwain Sarna, OD [place], [place] [id number]  HISTORICAL INFORMATION:   Selected notes from the MEDICAL RECORD NUMBER Referral from Dr. Orson Gear for concern of mac degen OS;  Ocular Hx - cataract OU; DES; Dry ARMD OU;  PMH -    CURRENT MEDICATIONS: No current outpatient prescriptions on file. (Ophthalmic Drugs)   No current facility-administered medications for this visit.  (Ophthalmic Drugs)   Current Outpatient Prescriptions (Other)  Medication Sig  . Multiple Vitamin (MULTIVITAMIN) tablet Take 1 tablet by mouth daily.  . fluconazole (DIFLUCAN) 150 MG tablet Take 1 tablet (150 mg total) by mouth daily. (Patient not taking: Reported on 03/08/2017)  . meclizine (ANTIVERT) 25 MG tablet Take 1 tablet (25 mg total) by mouth 3 (three) times daily as  needed for dizziness. (Patient not taking: Reported on 03/08/2017)   No current facility-administered medications for this visit.  (Other)      REVIEW OF SYSTEMS: ROS    Positive for: Eyes   Negative for: Constitutional, Gastrointestinal, Neurological, Skin, Genitourinary, Musculoskeletal, HENT, Endocrine, Cardiovascular, Respiratory, Psychiatric, Allergic/Imm, Heme/Lymph   Last edited by Alyse Low on 03/08/2017 10:09 AM. (History)       ALLERGIES Allergies  Allergen Reactions  . Avelox [Moxifloxacin Hcl In Nacl] Anaphylaxis    PAST MEDICAL HISTORY Past Medical History:  Diagnosis Date  . Chicken pox   . Colon polyps    benign  . GERD (gastroesophageal reflux disease)   . Urinary tract bacterial infections    Past Surgical History:  Procedure Laterality Date  . CHOLECYSTECTOMY  1985  . CYST REMOVAL NECK     Dr. Phillip Heal  . [place]    . TUBAL LIGATION    . VAGINAL DELIVERY     2    FAMILY HISTORY Family History  Problem Relation Age of Onset  . Arthritis Mother   . Hypertension Mother   . Cataracts Mother   . Cancer Sister        Breast  . Breast cancer Sister 23  . Amblyopia Neg Hx   . Blindness Neg Hx   . Glaucoma Neg Hx   . Macular degeneration Neg Hx   . Retinal detachment Neg Hx   . Stroke Neg Hx   .  Strabismus Neg Hx   . Retinitis pigmentosa Neg Hx     SOCIAL HISTORY Social History  Substance Use Topics  . Smoking status: Never Smoker  . Smokeless tobacco: Never Used  . Alcohol use No         OPHTHALMIC EXAM:  Base Eye Exam    Visual Acuity (Snellen - Linear)      Right Left   Dist [place] 20/40 20/40   Dist ph  20/30 20/30       Tonometry (Tonopen, 10:21 AM)      Right Left   Pressure 14 15       Pupils      Dark Light Shape React APD   Right 3 2 Round Sluggish None   Left 3 2 Round Sluggish None       Visual Fields (Counting fingers)      Left Right    Full Full       Extraocular Movement       Right Left    Full, Ortho Full, Ortho   Notes vertical diplopia in left gaze       Neuro/Psych    Oriented x3:  Yes   Mood/Affect:  Normal       Dilation    Both eyes:  1.0% Mydriacyl, 2.5% Phenylephrine @ 10:21 AM        Slit Lamp and Fundus Exam    External Exam      Right Left   External Normal Normal       Slit Lamp Exam      Right Left   Lids/Lashes Normal Normal   Conjunctiva/Sclera White and quiet White and quiet   Cornea Clear Clear   Anterior Chamber Deep and quiet Deep and quiet   Iris Round and dilated to 72m Round and dilated to 758m  Lens 3+ Cortical cataract, 2+ Nuclear sclerosis 3+ Cortical cataract, 2+ Nuclear sclerosis   Vitreous Posterior vitreous detachment Posterior vitreous detachment       Fundus Exam      Right Left   Disc Normal Normal   C/D Ratio 0.3 0.3   Macula Flat; , Drusen; No edema, no hemorrhage Flat; , Retinal pigment epithelial mottling, Drusen   Vessels Vascular attenuation- mild Vascular attenuation - mild   Periphery attached; , Reticular degeneration;  attached; , Reticular degeneration        Refraction    Manifest Refraction      Sphere Cylinder Dist VA   Right +0.50 Sphere 20/30   Left +0.50 Sphere 20/25          IMAGING AND PROCEDURES  Imaging and Procedures for 03/08/17  OCT, Retina - OU - Both Eyes     Right Eye Quality was good. Central Foveal Thickness: 260. Findings include normal foveal contour, no IRF, no SRF (Drusen; irregular ellisipsoid).   Left Eye Quality was good. Central Foveal Thickness: 258. Findings include normal foveal contour, no IRF, no SRF (Significant drusen; central retinal drusen and outer retinal changes; ).   Notes Images taken, stored on drive  Diagnosis / Impression:  nonexudative ARMD OU  Clinical management:  See below  Abbreviations: NFP - Normal foveal profile. CME - cystoid macular edema. PED - pigment epithelial detachment. IRF - intraretinal fluid. SRF -  subretinal fluid. EZ - ellipsoid zone. ERM - epiretinal membrane. ORA - outer retinal atrophy. ORT - outer retinal tubulation. SRHM - subretinal hyper-reflective material  ASSESSMENT/PLAN:    ICD-10-CM   1. Intermediate stage nonexudative age-related macular degeneration of both eyes H35.3132 OCT, Retina - OU - Both Eyes  2. Mixed type age-related cataract, both eyes H25.813   3. Visual disturbance H53.9   4. Vertical diplopia H53.2     1. Intermediate stage nonexudative age related macular degeneration, both eyes  - The incidence, anatomy, and pathology of dry AMD, risk of progression, and the AREDS and AREDS 2 study including smoking risks discussed with patient.  - Recommend continuation of AREDS vitamins  - Avoid tobacco products  - [place]ler grid for weekly vision checks.  Patient instructed to test one eye at a time.    - Patient to call us if appearance of grid is changing (lines curved or missing) or other changes in vision are noted.   - f/u 6 months, sooner prn  2. Cortical and Nuclear Cataract OU -   - The symptoms of cataract, surgical options, and treatments and risks were discussed with patient.  - discussed diagnosis and progression  - not yet visually significant  - monitor for now  3,4. Visual disturbance with vertical binocular diplopia in left gaze only  - pt reports straight lines/images appear slanted while in left gaze -- normal in primary gaze  - also complains that she develops a vertical binocular diplopia in left gaze -- not present in primary gaze  - induced grossly on exam -- no motility abnormality  - had MRI done at outside facility which was reportedly normal  - suspect symptoms may be due to cortical cataract vs mild strabismus  - recommend cataract eval (was already referred to Dr. Herbert Deaner), then adult strabismus referral if symptoms fail to improve following cataract surgery  Ophthalmic Meds Ordered this visit:  No orders of the  defined types were placed in this encounter.      Return in about 6 months (around 09/06/2017) for F/U ARMD OU.  There are no Patient Instructions on file for this visit.   Explained the diagnoses, plan, and follow up with the patient and they expressed understanding.  Patient expressed understanding of the importance of proper follow up care.   Gardiner Sleeper, M.D., Ph.D. Vitreoretinal Surgeon [facility] 03/08/17     Abbreviations: M myopia (nearsighted); A astigmatism; H hyperopia (farsighted); P presbyopia; Mrx spectacle prescription;  CTL contact lenses; OD right eye; OS left eye; OU both eyes  XT exotropia; ET esotropia; PEK punctate epithelial keratitis; PEE punctate epithelial erosions; DES dry eye syndrome; MGD meibomian gland dysfunction; ATs artificial tears; PFAT's preservative free artificial tears; [place] nuclear sclerotic cataract; PSC posterior subcapsular cataract; ERM epi-retinal membrane; PVD posterior vitreous detachment; RD retinal detachment; DM diabetes mellitus; DR diabetic retinopathy; NPDR non-proliferative diabetic retinopathy; PDR proliferative diabetic retinopathy; CSME clinically significant macular edema; DME diabetic macular edema; dbh dot blot hemorrhages; CWS cotton wool spot; POAG primary open angle glaucoma; C/D cup-to-disc ratio; HVF humphrey visual field; GVF goldmann visual field; OCT optical coherence tomography; IOP intraocular pressure; BRVO Branch retinal vein occlusion; CRVO central retinal vein occlusion; CRAO central retinal artery occlusion; BRAO branch retinal artery occlusion; RT retinal tear; SB scleral buckle; PPV pars plana vitrectomy; VH Vitreous hemorrhage; PRP panretinal laser photocoagulation; IVK intravitreal kenalog; VMT vitreomacular traction; MH Macular hole;  NVD neovascularization of the disc; NVE neovascularization elsewhere; AREDS age related eye disease study; ARMD age related macular degeneration; POAG primary  open angle glaucoma; EBMD epithelial/anterior basement membrane dystrophy; ACIOL anterior chamber  intraocular lens; IOL intraocular lens; PCIOL posterior chamber intraocular lens; Phaco/IOL phacoemulsification with intraocular lens placement; [place] photorefractive keratectomy; LASIK laser assisted in situ keratomileusis; HTN hypertension; DM diabetes mellitus; COPD chronic obstructive pulmonary disease

## 2017-03-08 ENCOUNTER — Encounter (INDEPENDENT_AMBULATORY_CARE_PROVIDER_SITE_OTHER): Payer: Self-pay | Admitting: Ophthalmology

## 2017-03-08 ENCOUNTER — Ambulatory Visit (INDEPENDENT_AMBULATORY_CARE_PROVIDER_SITE_OTHER): Payer: PPO | Admitting: Ophthalmology

## 2017-03-08 DIAGNOSIS — H532 Diplopia: Secondary | ICD-10-CM | POA: Diagnosis not present

## 2017-03-08 DIAGNOSIS — H539 Unspecified visual disturbance: Secondary | ICD-10-CM

## 2017-03-08 DIAGNOSIS — H353132 Nonexudative age-related macular degeneration, bilateral, intermediate dry stage: Secondary | ICD-10-CM | POA: Diagnosis not present

## 2017-03-08 DIAGNOSIS — H25813 Combined forms of age-related cataract, bilateral: Secondary | ICD-10-CM | POA: Diagnosis not present

## 2017-03-16 DIAGNOSIS — Z1211 Encounter for screening for malignant neoplasm of colon: Secondary | ICD-10-CM | POA: Diagnosis not present

## 2017-03-30 DIAGNOSIS — H2513 Age-related nuclear cataract, bilateral: Secondary | ICD-10-CM | POA: Diagnosis not present

## 2017-03-30 DIAGNOSIS — H353111 Nonexudative age-related macular degeneration, right eye, early dry stage: Secondary | ICD-10-CM | POA: Diagnosis not present

## 2017-03-30 DIAGNOSIS — H2512 Age-related nuclear cataract, left eye: Secondary | ICD-10-CM | POA: Diagnosis not present

## 2017-03-30 DIAGNOSIS — H532 Diplopia: Secondary | ICD-10-CM | POA: Diagnosis not present

## 2017-03-30 DIAGNOSIS — H25013 Cortical age-related cataract, bilateral: Secondary | ICD-10-CM | POA: Diagnosis not present

## 2017-03-30 DIAGNOSIS — H25012 Cortical age-related cataract, left eye: Secondary | ICD-10-CM | POA: Diagnosis not present

## 2017-04-18 DIAGNOSIS — H2512 Age-related nuclear cataract, left eye: Secondary | ICD-10-CM | POA: Diagnosis not present

## 2017-04-18 DIAGNOSIS — H25812 Combined forms of age-related cataract, left eye: Secondary | ICD-10-CM | POA: Diagnosis not present

## 2017-04-26 DIAGNOSIS — H2512 Age-related nuclear cataract, left eye: Secondary | ICD-10-CM | POA: Diagnosis not present

## 2017-05-08 DIAGNOSIS — H25011 Cortical age-related cataract, right eye: Secondary | ICD-10-CM | POA: Diagnosis not present

## 2017-05-08 DIAGNOSIS — H2511 Age-related nuclear cataract, right eye: Secondary | ICD-10-CM | POA: Diagnosis not present

## 2017-05-23 DIAGNOSIS — H2511 Age-related nuclear cataract, right eye: Secondary | ICD-10-CM | POA: Diagnosis not present

## 2017-05-23 DIAGNOSIS — H25811 Combined forms of age-related cataract, right eye: Secondary | ICD-10-CM | POA: Diagnosis not present

## 2017-05-23 DIAGNOSIS — H2512 Age-related nuclear cataract, left eye: Secondary | ICD-10-CM | POA: Diagnosis not present

## 2017-08-04 ENCOUNTER — Encounter: Payer: Self-pay | Admitting: *Deleted

## 2017-08-07 ENCOUNTER — Encounter: Payer: Self-pay | Admitting: Certified Registered Nurse Anesthetist

## 2017-08-07 ENCOUNTER — Encounter: Admission: RE | Payer: Self-pay | Source: Ambulatory Visit

## 2017-08-07 ENCOUNTER — Ambulatory Visit: Admission: RE | Admit: 2017-08-07 | Payer: PPO | Source: Ambulatory Visit | Admitting: Unknown Physician Specialty

## 2017-08-07 SURGERY — COLONOSCOPY WITH PROPOFOL
Anesthesia: General

## 2017-08-07 MED ORDER — LIDOCAINE HCL (PF) 2 % IJ SOLN
INTRAMUSCULAR | Status: AC
  Filled 2017-08-07: qty 10

## 2017-08-07 MED ORDER — PROPOFOL 10 MG/ML IV BOLUS
INTRAVENOUS | Status: AC
  Filled 2017-08-07: qty 20

## 2017-08-31 NOTE — Progress Notes (Deleted)
[facility] Note  09/05/2017     CHIEF COMPLAINT Patient presents for No chief complaint on file.   HISTORY OF PRESENT ILLNESS: Cassie Torres is a 67 y.o. female who presents to the clinic today for:     Referring physician: Marinda Elk, MD [place], [place] [address]  HISTORICAL INFORMATION:   Selected notes from the MEDICAL RECORD NUMBER Referral from Dr. Orson Gear for concern of mac degen OS;  Ocular Hx - cataract OU; DES; Dry ARMD OU;  PMH -    CURRENT MEDICATIONS: No current outpatient medications on file. (Ophthalmic Drugs)   No current facility-administered medications for this visit.  (Ophthalmic Drugs)   Current Outpatient Medications (Other)  Medication Sig  . BROMPHENIRAMINE-PSEUDOEPH PO Take by mouth daily.  . calcium-vitamin D (OSCAL WITH D) 500-200 MG-UNIT tablet Take 1 tablet by mouth 2 (two) times daily.  Marland Kitchen CAPSICUM-GARLIC PO Take by mouth daily.  . fluconazole (DIFLUCAN) 150 MG tablet Take 1 tablet (150 mg total) by mouth daily. (Patient not taking: Reported on 03/08/2017)  . magnesium oxide (MAG-OX) 400 MG tablet Take 400 mg by mouth daily.  . meclizine (ANTIVERT) 25 MG tablet Take 1 tablet (25 mg total) by mouth 3 (three) times daily as needed for dizziness. (Patient not taking: Reported on 03/08/2017)  . Multiple Vitamin (MULTIVITAMIN) tablet Take 1 tablet by mouth daily.   No current facility-administered medications for this visit.  (Other)      REVIEW OF SYSTEMS:    ALLERGIES Allergies  Allergen Reactions  . Atarax [Hydroxyzine] Anaphylaxis  . Avelox [Moxifloxacin Hcl In Nacl] Anaphylaxis    PAST MEDICAL HISTORY Past Medical History:  Diagnosis Date  . Chicken pox   . Colon polyps    benign  . GERD (gastroesophageal reflux disease)   . Urinary tract bacterial infections    Past Surgical History:  Procedure Laterality Date  . CHOLECYSTECTOMY  1985  .  CYST REMOVAL NECK     Dr. Phillip Heal  . [place]    . TUBAL LIGATION    . VAGINAL DELIVERY     2    FAMILY HISTORY Family History  Problem Relation Age of Onset  . Arthritis Mother   . Hypertension Mother   . Cataracts Mother   . Alzheimer's disease Mother   . Cancer Sister        Breast  . Breast cancer Sister 42  . Amblyopia Neg Hx   . Blindness Neg Hx   . Glaucoma Neg Hx   . Macular degeneration Neg Hx   . Retinal detachment Neg Hx   . Stroke Neg Hx   . Strabismus Neg Hx   . Retinitis pigmentosa Neg Hx     SOCIAL HISTORY Social History   Tobacco Use  . Smoking status: Never Smoker  . Smokeless tobacco: Never Used  Substance Use Topics  . Alcohol use: No  . Drug use: No         OPHTHALMIC EXAM:   Not recorded      IMAGING AND PROCEDURES  Imaging and Procedures for 08/31/17           ASSESSMENT/PLAN:    ICD-10-CM   1. Intermediate stage nonexudative age-related macular degeneration of both eyes H35.3132 OCT, Retina - OU - Both Eyes  2. Mixed type age-related cataract, both eyes H25.813   3. Visual disturbance H53.9   4. Vertical diplopia H53.2  1. Intermediate stage nonexudative age related macular degeneration, both eyes  - The incidence, anatomy, and pathology of dry AMD, risk of progression, and the AREDS and AREDS 2 study including smoking risks discussed with patient.  - Recommend continuation of AREDS vitamins  - Avoid tobacco products  - Amsler grid for weekly vision checks.  Patient instructed to test one eye at a time.    - Patient to call us if appearance of grid is changing (lines curved or missing) or other changes in vision are noted.   - f/u 6 months, sooner prn  2. Cortical and Nuclear Cataract OU -   - The symptoms of cataract, surgical options, and treatments and risks were discussed with patient.  - discussed diagnosis and progression  - not yet visually significant  - monitor for now  3,4. Visual  disturbance with vertical binocular diplopia in left gaze only  - pt reports straight lines/images appear slanted while in left gaze -- normal in primary gaze  - also complains that she develops a vertical binocular diplopia in left gaze -- not present in primary gaze  - induced grossly on exam -- no motility abnormality  - had MRI done at outside facility which was reportedly normal  - suspect symptoms may be due to cortical cataract vs mild strabismus  - recommend cataract eval (was already referred to Dr. Herbert Deaner), then adult strabismus referral if symptoms fail to improve following cataract surgery  Ophthalmic Meds Ordered this visit:  No orders of the defined types were placed in this encounter.      No follow-ups on file.  There are no Patient Instructions on file for this visit.   Explained the diagnoses, plan, and follow up with the patient and they expressed understanding.  Patient expressed understanding of the importance of proper follow up care.   This document serves as a record of services personally performed by Gardiner Sleeper, MD, PhD. It was created on their behalf by Catha Brow, [place], a certified ophthalmic assistant. The creation of this record is the provider's dictation and/or activities during the visit.  Electronically signed by: Catha Brow, [place]  08/31/17 8:51 AM   Gardiner Sleeper, M.D., Ph.D. Vitreoretinal Surgeon [facility] 08/31/17     Abbreviations: M myopia (nearsighted); A astigmatism; H hyperopia (farsighted); P presbyopia; Mrx spectacle prescription;  CTL contact lenses; OD right eye; OS left eye; OU both eyes  XT exotropia; ET esotropia; PEK punctate epithelial keratitis; PEE punctate epithelial erosions; DES dry eye syndrome; MGD meibomian gland dysfunction; ATs artificial tears; PFAT's preservative free artificial tears; [place] nuclear sclerotic cataract; PSC posterior subcapsular cataract; ERM epi-retinal membrane;  PVD posterior vitreous detachment; RD retinal detachment; DM diabetes mellitus; DR diabetic retinopathy; NPDR non-proliferative diabetic retinopathy; PDR proliferative diabetic retinopathy; CSME clinically significant macular edema; DME diabetic macular edema; dbh dot blot hemorrhages; CWS cotton wool spot; POAG primary open angle glaucoma; C/D cup-to-disc ratio; HVF humphrey visual field; GVF goldmann visual field; OCT optical coherence tomography; IOP intraocular pressure; BRVO Branch retinal vein occlusion; CRVO central retinal vein occlusion; CRAO central retinal artery occlusion; BRAO branch retinal artery occlusion; RT retinal tear; SB scleral buckle; PPV pars plana vitrectomy; VH Vitreous hemorrhage; PRP panretinal laser photocoagulation; IVK intravitreal kenalog; VMT vitreomacular traction; MH Macular hole;  NVD neovascularization of the disc; NVE neovascularization elsewhere; AREDS age related eye disease study; ARMD age related macular degeneration; POAG primary open angle glaucoma; EBMD epithelial/anterior basement membrane dystrophy; ACIOL anterior chamber  intraocular lens; IOL intraocular lens; PCIOL posterior chamber intraocular lens; Phaco/IOL phacoemulsification with intraocular lens placement; [place] photorefractive keratectomy; LASIK laser assisted in situ keratomileusis; HTN hypertension; DM diabetes mellitus; COPD chronic obstructive pulmonary disease

## 2017-09-05 ENCOUNTER — Encounter (INDEPENDENT_AMBULATORY_CARE_PROVIDER_SITE_OTHER): Payer: PPO | Admitting: Ophthalmology

## 2017-09-07 DIAGNOSIS — Z961 Presence of intraocular lens: Secondary | ICD-10-CM | POA: Diagnosis not present

## 2017-09-07 DIAGNOSIS — H353132 Nonexudative age-related macular degeneration, bilateral, intermediate dry stage: Secondary | ICD-10-CM | POA: Diagnosis not present

## 2017-10-12 DIAGNOSIS — M1712 Unilateral primary osteoarthritis, left knee: Secondary | ICD-10-CM | POA: Diagnosis not present

## 2017-10-12 DIAGNOSIS — M25362 Other instability, left knee: Secondary | ICD-10-CM | POA: Diagnosis not present

## 2017-10-12 DIAGNOSIS — M25562 Pain in left knee: Secondary | ICD-10-CM | POA: Diagnosis not present

## 2017-11-17 DIAGNOSIS — J0110 Acute frontal sinusitis, unspecified: Secondary | ICD-10-CM | POA: Diagnosis not present

## 2017-11-17 DIAGNOSIS — M1712 Unilateral primary osteoarthritis, left knee: Secondary | ICD-10-CM | POA: Diagnosis not present

## 2018-06-12 DIAGNOSIS — S62643A Nondisplaced fracture of proximal phalanx of left middle finger, initial encounter for closed fracture: Secondary | ICD-10-CM | POA: Diagnosis not present

## 2018-06-21 DIAGNOSIS — E782 Mixed hyperlipidemia: Secondary | ICD-10-CM | POA: Diagnosis not present

## 2018-06-21 DIAGNOSIS — R9431 Abnormal electrocardiogram [ECG] [EKG]: Secondary | ICD-10-CM | POA: Diagnosis not present

## 2018-06-21 DIAGNOSIS — Z1239 Encounter for other screening for malignant neoplasm of breast: Secondary | ICD-10-CM | POA: Diagnosis not present

## 2018-06-21 DIAGNOSIS — Z0000 Encounter for general adult medical examination without abnormal findings: Secondary | ICD-10-CM | POA: Diagnosis not present

## 2018-06-21 DIAGNOSIS — Z1211 Encounter for screening for malignant neoplasm of colon: Secondary | ICD-10-CM | POA: Diagnosis not present

## 2018-06-21 DIAGNOSIS — R700 Elevated erythrocyte sedimentation rate: Secondary | ICD-10-CM | POA: Diagnosis not present

## 2018-06-21 DIAGNOSIS — R768 Other specified abnormal immunological findings in serum: Secondary | ICD-10-CM | POA: Diagnosis not present

## 2018-06-21 DIAGNOSIS — R7303 Prediabetes: Secondary | ICD-10-CM | POA: Diagnosis not present

## 2018-06-21 DIAGNOSIS — R0609 Other forms of dyspnea: Secondary | ICD-10-CM | POA: Diagnosis not present

## 2018-06-21 DIAGNOSIS — Z780 Asymptomatic menopausal state: Secondary | ICD-10-CM | POA: Diagnosis not present

## 2018-06-21 DIAGNOSIS — S63633A Sprain of interphalangeal joint of left middle finger, initial encounter: Secondary | ICD-10-CM | POA: Diagnosis not present

## 2018-06-21 DIAGNOSIS — R002 Palpitations: Secondary | ICD-10-CM | POA: Diagnosis not present

## 2018-06-21 DIAGNOSIS — I208 Other forms of angina pectoris: Secondary | ICD-10-CM | POA: Diagnosis not present

## 2018-06-27 ENCOUNTER — Other Ambulatory Visit: Payer: Self-pay | Admitting: Physician Assistant

## 2018-06-27 DIAGNOSIS — Z1231 Encounter for screening mammogram for malignant neoplasm of breast: Secondary | ICD-10-CM

## 2018-07-02 DIAGNOSIS — M8588 Other specified disorders of bone density and structure, other site: Secondary | ICD-10-CM | POA: Diagnosis not present

## 2018-07-12 DIAGNOSIS — I208 Other forms of angina pectoris: Secondary | ICD-10-CM | POA: Diagnosis not present

## 2018-07-19 DIAGNOSIS — E782 Mixed hyperlipidemia: Secondary | ICD-10-CM | POA: Diagnosis not present

## 2018-07-19 DIAGNOSIS — I10 Essential (primary) hypertension: Secondary | ICD-10-CM | POA: Diagnosis not present

## 2018-07-19 DIAGNOSIS — R0789 Other chest pain: Secondary | ICD-10-CM | POA: Diagnosis not present

## 2018-07-30 DIAGNOSIS — L230 Allergic contact dermatitis due to metals: Secondary | ICD-10-CM | POA: Diagnosis not present

## 2018-07-30 DIAGNOSIS — L811 Chloasma: Secondary | ICD-10-CM | POA: Diagnosis not present

## 2018-08-02 DIAGNOSIS — S63633A Sprain of interphalangeal joint of left middle finger, initial encounter: Secondary | ICD-10-CM | POA: Diagnosis not present

## 2018-09-13 DIAGNOSIS — I10 Essential (primary) hypertension: Secondary | ICD-10-CM | POA: Diagnosis not present

## 2018-09-13 DIAGNOSIS — E782 Mixed hyperlipidemia: Secondary | ICD-10-CM | POA: Diagnosis not present

## 2018-10-30 IMAGING — MG MM DIGITAL SCREENING BILAT W/ CAD
4 series · 4 of 4 positions shown · non-contrast
Comparison: Previous exam(s).

CLINICAL DATA: Screening.

EXAM:
DIGITAL SCREENING BILATERAL MAMMOGRAM WITH CAD

[L MLO]
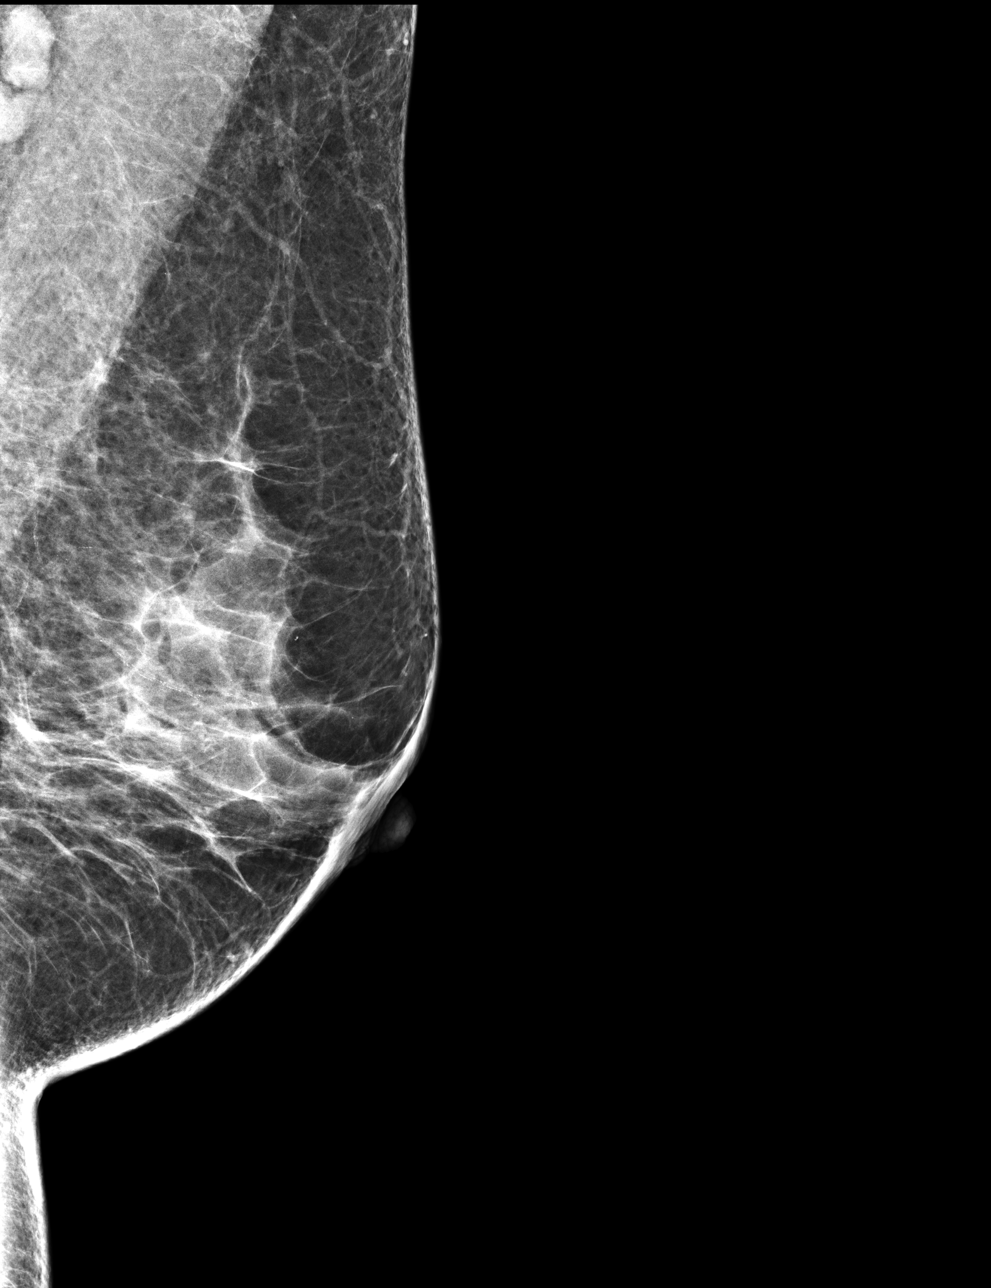

[R MLO]
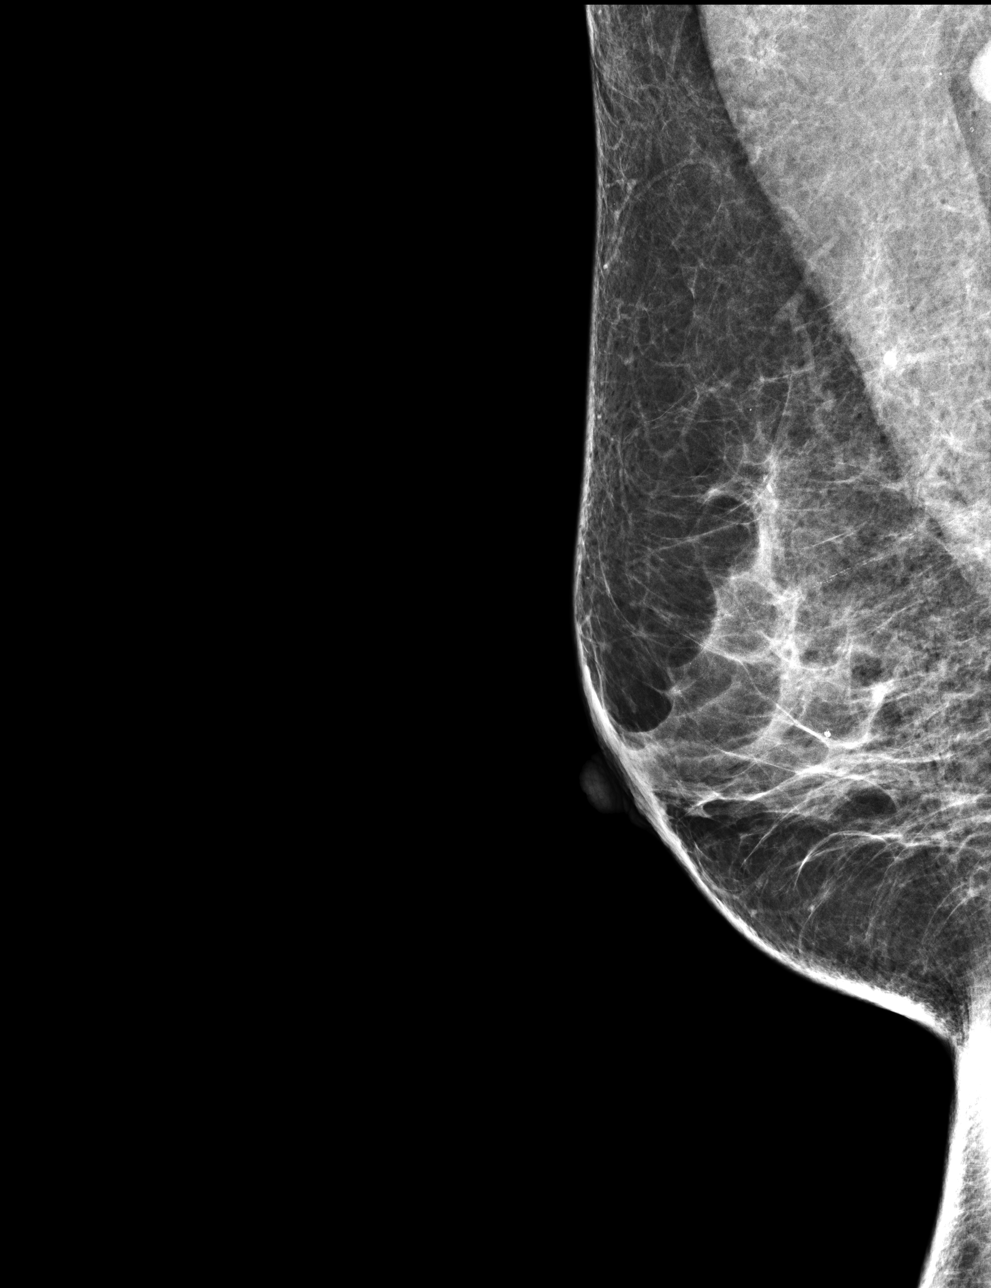

[R CC]
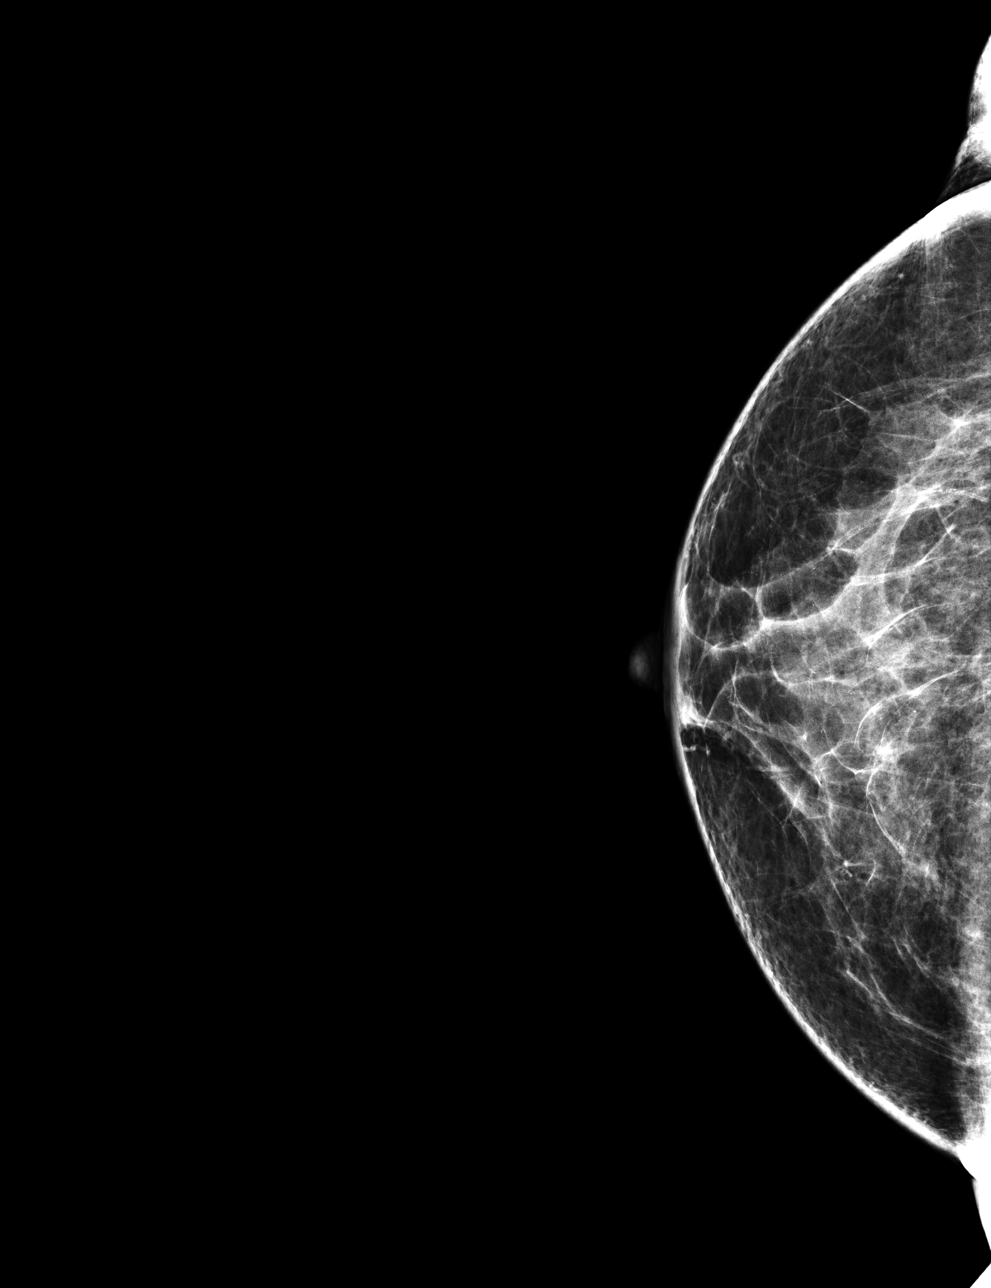

[L CC]
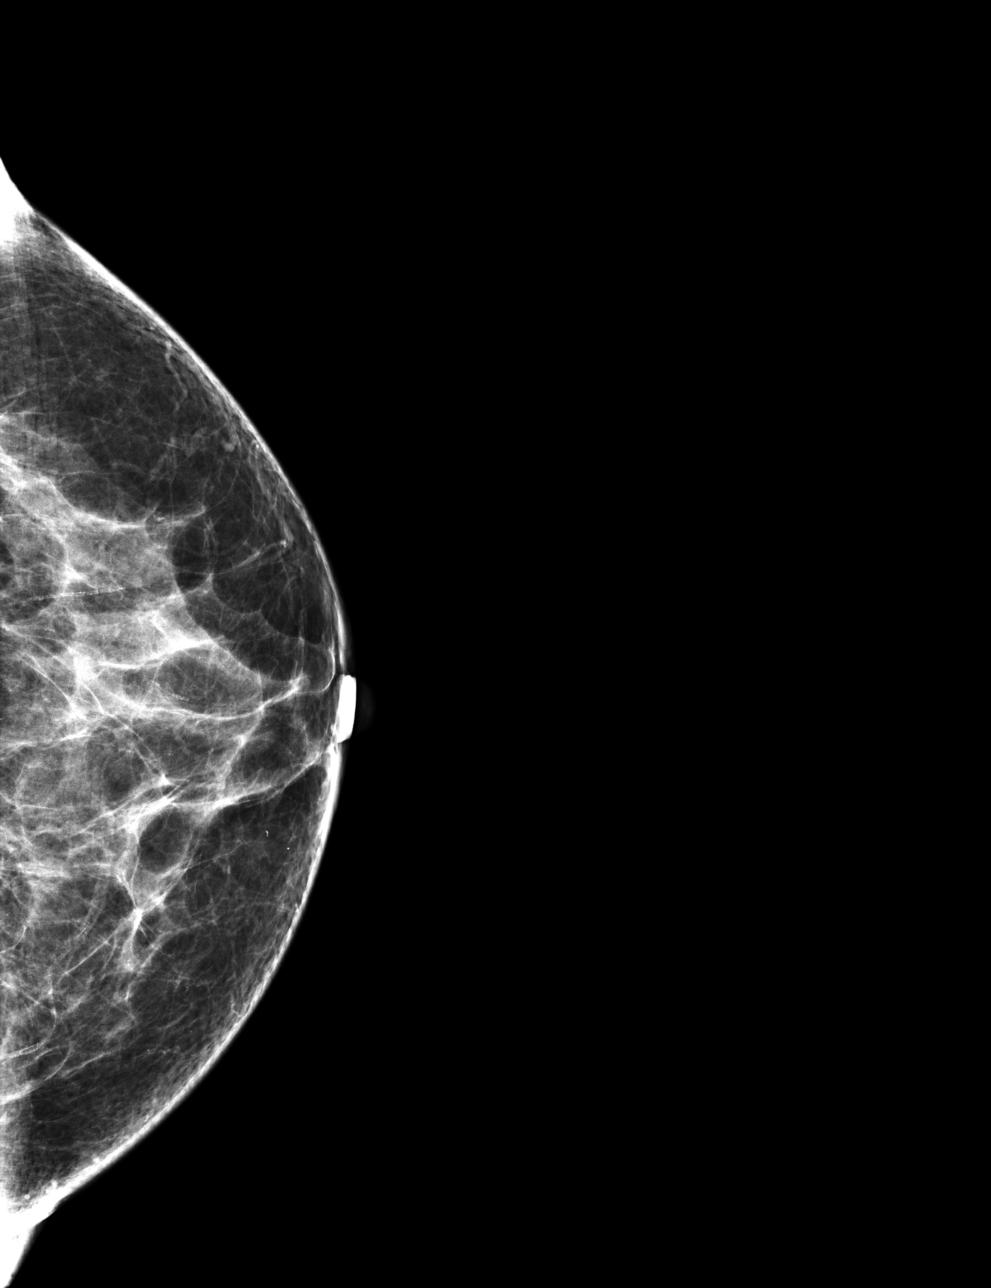

[4 of 4 positions shown; findings below may reference images not displayed]

ACR Breast Density Category c: The breast tissue is heterogeneously
dense, which may obscure small masses.
FINDINGS: There are no findings suspicious for malignancy. Images were
processed with CAD.
IMPRESSION: No mammographic evidence of malignancy. A result letter of this
screening mammogram will be mailed directly to the patient.

RECOMMENDATION:
Screening mammogram in one year. (Code:[id number])

BI-RADS CATEGORY  1: Negative.

## 2019-03-14 DIAGNOSIS — R102 Pelvic and perineal pain: Secondary | ICD-10-CM | POA: Diagnosis not present

## 2019-03-28 DIAGNOSIS — I10 Essential (primary) hypertension: Secondary | ICD-10-CM | POA: Diagnosis not present

## 2019-03-28 DIAGNOSIS — E782 Mixed hyperlipidemia: Secondary | ICD-10-CM | POA: Diagnosis not present

## 2019-03-29 ENCOUNTER — Other Ambulatory Visit: Payer: Self-pay

## 2019-03-29 DIAGNOSIS — E782 Mixed hyperlipidemia: Secondary | ICD-10-CM

## 2019-03-29 NOTE — Progress Notes (Signed)
We will send to all Cardiologist to read and Dr Nehemiah Massed will call results to patient.   Name Elli Sakamoto  DOB Apr 03, 1951  DX Hyperlipidemia, mixed (E78.2)

## 2019-04-08 DIAGNOSIS — R399 Unspecified symptoms and signs involving the genitourinary system: Secondary | ICD-10-CM | POA: Diagnosis not present

## 2019-04-18 ENCOUNTER — Other Ambulatory Visit: Payer: Self-pay

## 2019-04-18 ENCOUNTER — Ambulatory Visit (INDEPENDENT_AMBULATORY_CARE_PROVIDER_SITE_OTHER)
Admission: RE | Admit: 2019-04-18 | Discharge: 2019-04-18 | Disposition: A | Discharge status: Home / Self Care | Payer: Self-pay | Source: Ambulatory Visit | Attending: Cardiovascular Disease | Admitting: Cardiovascular Disease

## 2019-04-18 DIAGNOSIS — E782 Mixed hyperlipidemia: Secondary | ICD-10-CM

## 2019-04-19 DIAGNOSIS — L508 Other urticaria: Secondary | ICD-10-CM | POA: Diagnosis not present

## 2019-04-25 DIAGNOSIS — E782 Mixed hyperlipidemia: Secondary | ICD-10-CM | POA: Diagnosis not present

## 2019-04-25 DIAGNOSIS — I10 Essential (primary) hypertension: Secondary | ICD-10-CM | POA: Diagnosis not present

## 2019-06-27 ENCOUNTER — Other Ambulatory Visit: Payer: Self-pay | Admitting: Physician Assistant

## 2019-06-27 DIAGNOSIS — Z1239 Encounter for other screening for malignant neoplasm of breast: Secondary | ICD-10-CM | POA: Diagnosis not present

## 2019-06-27 DIAGNOSIS — Z0000 Encounter for general adult medical examination without abnormal findings: Secondary | ICD-10-CM | POA: Diagnosis not present

## 2019-06-27 DIAGNOSIS — R7303 Prediabetes: Secondary | ICD-10-CM | POA: Diagnosis not present

## 2019-06-27 DIAGNOSIS — E782 Mixed hyperlipidemia: Secondary | ICD-10-CM | POA: Diagnosis not present

## 2019-06-27 DIAGNOSIS — Z1231 Encounter for screening mammogram for malignant neoplasm of breast: Secondary | ICD-10-CM

## 2019-06-27 DIAGNOSIS — I10 Essential (primary) hypertension: Secondary | ICD-10-CM | POA: Diagnosis not present

## 2019-06-27 DIAGNOSIS — Z1211 Encounter for screening for malignant neoplasm of colon: Secondary | ICD-10-CM | POA: Diagnosis not present

## 2019-07-19 ENCOUNTER — Ambulatory Visit
Admission: RE | Admit: 2019-07-19 | Discharge: 2019-07-19 | Disposition: A | Discharge status: Home / Self Care | Payer: PPO | Source: Ambulatory Visit | Attending: Physician Assistant | Admitting: Physician Assistant

## 2019-07-19 DIAGNOSIS — Z1231 Encounter for screening mammogram for malignant neoplasm of breast: Secondary | ICD-10-CM | POA: Insufficient documentation

## 2019-09-05 DIAGNOSIS — K5900 Constipation, unspecified: Secondary | ICD-10-CM | POA: Diagnosis not present

## 2019-09-05 DIAGNOSIS — Z1211 Encounter for screening for malignant neoplasm of colon: Secondary | ICD-10-CM | POA: Diagnosis not present

## 2019-09-05 DIAGNOSIS — K219 Gastro-esophageal reflux disease without esophagitis: Secondary | ICD-10-CM | POA: Diagnosis not present

## 2019-12-10 ENCOUNTER — Other Ambulatory Visit
Admission: RE | Admit: 2019-12-10 | Discharge: 2019-12-10 | Disposition: A | Discharge status: Home / Self Care | Payer: PPO | Source: Ambulatory Visit | Attending: Internal Medicine | Admitting: Internal Medicine

## 2019-12-10 ENCOUNTER — Other Ambulatory Visit: Payer: Self-pay

## 2019-12-10 DIAGNOSIS — Z01812 Encounter for preprocedural laboratory examination: Secondary | ICD-10-CM | POA: Insufficient documentation

## 2019-12-10 DIAGNOSIS — Z20822 Contact with and (suspected) exposure to covid-19: Secondary | ICD-10-CM | POA: Diagnosis not present

## 2019-12-10 LAB — SARS CORONAVIRUS 2 (TAT 6-24 HRS): SARS Coronavirus 2: NEGATIVE

## 2019-12-11 ENCOUNTER — Encounter: Payer: Self-pay | Admitting: Internal Medicine

## 2019-12-12 ENCOUNTER — Encounter: Payer: Self-pay | Admitting: Internal Medicine

## 2019-12-12 ENCOUNTER — Ambulatory Visit
Admission: RE | Admit: 2019-12-12 | Discharge: 2019-12-12 | Disposition: A | Discharge status: Home / Self Care | Payer: PPO | Attending: Internal Medicine | Admitting: Internal Medicine

## 2019-12-12 ENCOUNTER — Other Ambulatory Visit: Payer: Self-pay

## 2019-12-12 ENCOUNTER — Ambulatory Visit: Payer: PPO | Admitting: Anesthesiology

## 2019-12-12 ENCOUNTER — Encounter
Admission: RE | Disposition: A | Discharge status: Home / Self Care | Payer: Self-pay | Source: Home / Self Care | Attending: Internal Medicine

## 2019-12-12 DIAGNOSIS — K635 Polyp of colon: Secondary | ICD-10-CM | POA: Diagnosis not present

## 2019-12-12 DIAGNOSIS — K5730 Diverticulosis of large intestine without perforation or abscess without bleeding: Secondary | ICD-10-CM | POA: Diagnosis not present

## 2019-12-12 DIAGNOSIS — Z1211 Encounter for screening for malignant neoplasm of colon: Secondary | ICD-10-CM | POA: Insufficient documentation

## 2019-12-12 DIAGNOSIS — I10 Essential (primary) hypertension: Secondary | ICD-10-CM | POA: Insufficient documentation

## 2019-12-12 DIAGNOSIS — K640 First degree hemorrhoids: Secondary | ICD-10-CM | POA: Insufficient documentation

## 2019-12-12 DIAGNOSIS — D120 Benign neoplasm of cecum: Secondary | ICD-10-CM | POA: Diagnosis not present

## 2019-12-12 DIAGNOSIS — Z7689 Persons encountering health services in other specified circumstances: Secondary | ICD-10-CM | POA: Diagnosis not present

## 2019-12-12 DIAGNOSIS — Z79899 Other long term (current) drug therapy: Secondary | ICD-10-CM | POA: Diagnosis not present

## 2019-12-12 DIAGNOSIS — D125 Benign neoplasm of sigmoid colon: Secondary | ICD-10-CM | POA: Insufficient documentation

## 2019-12-12 HISTORY — DX: Essential (primary) hypertension: I10

## 2019-12-12 HISTORY — PX: COLONOSCOPY WITH PROPOFOL: SHX5780

## 2019-12-12 SURGERY — COLONOSCOPY WITH PROPOFOL
Anesthesia: General

## 2019-12-12 MED ORDER — PROPOFOL 500 MG/50ML IV EMUL
INTRAVENOUS | Status: DC | PRN
  Administered 2019-12-12: 150 ug/kg/min via INTRAVENOUS

## 2019-12-12 MED ORDER — SODIUM CHLORIDE 0.9 % IV SOLN: INTRAVENOUS | Status: DC

## 2019-12-12 MED ORDER — PROPOFOL 500 MG/50ML IV EMUL
INTRAVENOUS | Status: AC
  Filled 2019-12-12: qty 50

## 2019-12-12 NOTE — H&P (Signed)
Outpatient short stay form Pre-procedure 12/12/2019 11:18 AM Candence Sease K. Alice Reichert, M.D.  Primary Physician: Paulita Cradle, M.D.  Reason for visit:  Colon cancer screening  History of present illness:  Patient presents for colonoscopy for colon cancer screening. The patient denies complaints of abdominal pain, significant change in bowel habits, or rectal bleeding.      Current Facility-Administered Medications:  .  0.9 %  sodium chloride infusion, , Intravenous, Continuous, [place], Benay Pike, MD, Last Rate: 20 mL/hr at 12/12/19 1028, Continued from Pre-op at 12/12/19 1028  Medications Prior to Admission  Medication Sig Dispense Refill Last Dose  . amLODipine (NORVASC) 5 MG tablet Take 5 mg by mouth daily.   12/11/2019 at 0800  . BROMPHENIRAMINE-PSEUDOEPH PO Take by mouth daily.   Past Week at Unknown time  . calcium-vitamin D (OSCAL WITH D) 500-200 MG-UNIT tablet Take 1 tablet by mouth 2 (two) times daily.   Past Week at Unknown time  . CAPSICUM-GARLIC PO Take by mouth daily.   Past Week at Unknown time  . fluconazole (DIFLUCAN) 150 MG tablet Take 1 tablet (150 mg total) by mouth daily. 3 tablet 0 Past Month at Unknown time  . magnesium oxide (MAG-OX) 400 MG tablet Take 400 mg by mouth daily.   Past Week at Unknown time  . Multiple Vitamin (MULTIVITAMIN) tablet Take 1 tablet by mouth daily.   Past Week at Unknown time  . meclizine (ANTIVERT) 25 MG tablet Take 1 tablet (25 mg total) by mouth 3 (three) times daily as needed for dizziness. (Patient not taking: Reported on 03/08/2017) 30 tablet 0 Completed Course at Unknown time     Allergies  Allergen Reactions  . Atarax [Hydroxyzine] Anaphylaxis  . Avelox [Moxifloxacin Hcl In Nacl] Anaphylaxis     Past Medical History:  Diagnosis Date  . Chicken pox   . Colon polyps    benign  . GERD (gastroesophageal reflux disease)   . Hypertension   . Urinary tract bacterial infections     Review of systems:  Otherwise negative.     Physical Exam  Gen: Alert, oriented. Appears stated age.  HEENT: /AT. PERRLA. Lungs: CTA, no wheezes. CV: RR nl S1, S2. Abd: soft, benign, no masses. BS+ Ext: No edema. Pulses 2+    Planned procedures: Proceed with colonoscopy. The patient understands the nature of the planned procedure, indications, risks, alternatives and potential complications including but not limited to bleeding, infection, perforation, damage to internal organs and possible oversedation/side effects from anesthesia. The patient agrees and gives consent to proceed.  Please refer to procedure notes for findings, recommendations and patient disposition/instructions.     Markisha Meding K. Alice Reichert, M.D. Gastroenterology 12/12/2019  11:18 AM

## 2019-12-12 NOTE — Anesthesia Preprocedure Evaluation (Addendum)
Anesthesia Evaluation  Patient identified by MRN, date of birth, ID band Patient awake    Reviewed: Allergy & Precautions, H&P , NPO status , Patient's Chart, lab work & pertinent test results  Airway Mallampati: I  TM Distance: >3 FB Neck ROM: full    Dental  (+) Teeth Intact   Pulmonary neg pulmonary ROS,           Cardiovascular hypertension,      Neuro/Psych negative neurological ROS  negative psych ROS   GI/Hepatic Neg liver ROS, GERD  Controlled,  Endo/Other  negative endocrine ROS  Renal/GU negative Renal ROS  negative genitourinary   Musculoskeletal   Abdominal   Peds  Hematology negative hematology ROS (+)   Anesthesia Other Findings Past Medical History: No date: Chicken pox No date: Colon polyps     Comment:  benign No date: GERD (gastroesophageal reflux disease) No date: Hypertension No date: Urinary tract bacterial infections  Past Surgical History: No date: CARDIAC CATHETERIZATION 1985: CHOLECYSTECTOMY No date: CYST REMOVAL NECK     Comment:  Dr. Phillip Heal No date: TONSILLECTOMY AND ADENOIDECTOMY No date: TUBAL LIGATION No date: VAGINAL DELIVERY     Comment:  2  BMI    Body Mass Index: 31.95 kg/m      Reproductive/Obstetrics negative OB ROS                            Anesthesia Physical Anesthesia Plan  ASA: II  Anesthesia Plan: General   Post-op Pain Management:    Induction:   PONV Risk Score and Plan: Propofol infusion and TIVA  Airway Management Planned: Natural Airway and Nasal Cannula  Additional Equipment:   Intra-op Plan:   Post-operative Plan:   Informed Consent: I have reviewed the patients History and Physical, chart, labs and discussed the procedure including the risks, benefits and alternatives for the proposed anesthesia with the patient or authorized representative who has indicated his/her understanding and acceptance.     Dental  Advisory Given  Plan Discussed with: Anesthesiologist and CRNA  Anesthesia Plan Comments:         Anesthesia Quick Evaluation

## 2019-12-12 NOTE — Transfer of Care (Signed)
Immediate Anesthesia Transfer of Care Note  Patient: Cassie Torres  Procedure(s) Performed: COLONOSCOPY WITH PROPOFOL (N/A )  Patient Location: PACU  Anesthesia Type:General  Level of Consciousness: awake and sedated  Airway & Oxygen Therapy: Patient Spontanous Breathing and Patient connected to nasal cannula oxygen  Post-op Assessment: Report given to RN and Post -op Vital signs reviewed and stable  Post vital signs: Reviewed and stable  Last Vitals:  Vitals Value Taken Time  BP    Temp    Pulse    Resp    SpO2      Last Pain:  Vitals:   12/12/19 1002  TempSrc: Temporal  PainSc: 0-No pain         Complications: No complications documented.

## 2019-12-12 NOTE — Interval H&P Note (Signed)
History and Physical Interval Note:  12/12/2019 11:18 AM  Cassie Torres  has presented today for surgery, with the diagnosis of COLON CANCER SCREENING.  The various methods of treatment have been discussed with the patient and family. After consideration of risks, benefits and other options for treatment, the patient has consented to  Procedure(s): COLONOSCOPY WITH PROPOFOL (N/A) as a surgical intervention.  The patient's history has been reviewed, patient examined, no change in status, stable for surgery.  I have reviewed the patient's chart and labs.  Questions were answered to the patient's satisfaction.     [place], [place]

## 2019-12-12 NOTE — Op Note (Signed)
[facility] Patient Name: Cassie Torres Procedure Date: 12/12/2019 11:18 AM MRN: [id number] Account #: [id number] Date of Birth: Jan 01, 1951 Admit Type: Outpatient Age: 69 Room: [facility] ENDO ROOM 1 Gender: Female Note Status: Finalized Procedure:             Colonoscopy Indications:           Screening for colorectal malignant neoplasm Providers:             Benay Pike. Alice Reichert MD, MD Referring MD:          Precious Bard, MD (Referring MD) Medicines:             Propofol per Anesthesia Complications:         No immediate complications. Procedure:             Pre-Anesthesia Assessment:                        - The risks and benefits of the procedure and the                         sedation options and risks were discussed with the                         patient. All questions were answered and informed                         consent was obtained.                        - Patient identification and proposed procedure were                         verified prior to the procedure by the nurse. The                         procedure was verified in the procedure room.                        - ASA Grade Assessment: III - A patient with severe                         systemic disease.                        - After reviewing the risks and benefits, the patient                         was deemed in satisfactory condition to undergo the                         procedure.                        After obtaining informed consent, the colonoscope was                         passed under direct vision. Throughout the procedure,                         the patient's blood pressure,  pulse, and oxygen                         saturations were monitored continuously. The                         Colonoscope was introduced through the anus and                         advanced to the the cecum, identified by appendiceal                         orifice and ileocecal  valve. The colonoscopy was                         performed without difficulty. The patient tolerated                         the procedure well. The quality of the bowel                         preparation was good. The ileocecal valve, appendiceal                         orifice, and rectum were photographed. Findings:      The perianal and digital rectal examinations were normal. Pertinent       negatives include normal sphincter tone and no palpable rectal lesions.      Non-bleeding internal hemorrhoids were found during retroflexion. The       hemorrhoids were Grade I (internal hemorrhoids that do not prolapse).      Multiple small and large-mouthed diverticula were found in the sigmoid       colon.      Two semi-pedunculated polyps were found in the sigmoid colon and cecum.       The polyps were 4 to 6 mm in size. These polyps were removed with a       jumbo cold forceps. Resection and retrieval were complete.      The exam was otherwise without abnormality. Impression:            - Non-bleeding internal hemorrhoids.                        - Diverticulosis in the sigmoid colon.                        - Two 4 to 6 mm polyps in the sigmoid colon and in the                         cecum, removed with a jumbo cold forceps. Resected and                         retrieved.                        - The examination was otherwise normal. Recommendation:        - Patient has a contact number available for  emergencies. The signs and symptoms of potential                         delayed complications were discussed with the patient.                         Return to normal activities tomorrow. Written                         discharge instructions were provided to the patient.                        - Resume previous diet.                        - Continue present medications.                        - Repeat colonoscopy is recommended for surveillance.                          The colonoscopy date will be determined after                         pathology results from today's exam become available                         for review.                        - Return to GI office PRN.                        - The findings and recommendations were discussed with                         the patient. Procedure Code(s):     --- Professional ---                        [phone], Colonoscopy, flexible; with biopsy, single or                         multiple Diagnosis Code(s):     --- Professional ---                        K57.30, Diverticulosis of large intestine without                         perforation or abscess without bleeding                        K63.5, Polyp of colon                        K64.0, First degree hemorrhoids                        Z12.11, Encounter for screening for malignant neoplasm                         of colon CPT  copyright 2019 American Medical Association. All rights reserved. The codes documented in this report are preliminary and upon coder review may  be revised to meet current compliance requirements. Efrain Sella MD, MD 12/12/2019 11:56:03 AM This report has been signed electronically. Number of Addenda: 0 Note Initiated On: 12/12/2019 11:18 AM Scope Withdrawal Time: 0 hours 4 minutes 58 seconds  Total Procedure Duration: 0 hours 17 minutes 50 seconds  Estimated Blood Loss:  Estimated blood loss: none.      [facility]

## 2019-12-12 NOTE — Anesthesia Procedure Notes (Signed)
Performed by: Cook-Martin, Geeta Dworkin Pre-anesthesia Checklist: Patient identified, Emergency Drugs available, Suction available, Patient being monitored and Timeout performed Patient Re-evaluated:Patient Re-evaluated prior to induction Oxygen Delivery Method: Nasal cannula Preoxygenation: Pre-oxygenation with 100% oxygen Induction Type: IV induction Placement Confirmation: positive ETCO2 and CO2 detector       

## 2019-12-13 ENCOUNTER — Encounter: Payer: Self-pay | Admitting: Internal Medicine

## 2019-12-13 LAB — SURGICAL PATHOLOGY

## 2019-12-13 NOTE — Anesthesia Postprocedure Evaluation (Signed)
Anesthesia Post Note  Patient: Cassie Torres  Procedure(s) Performed: COLONOSCOPY WITH PROPOFOL (N/A )  Patient location during evaluation: PACU Anesthesia Type: General Level of consciousness: awake and alert Pain management: pain level controlled Vital Signs Assessment: post-procedure vital signs reviewed and stable Respiratory status: spontaneous breathing, nonlabored ventilation and respiratory function stable Cardiovascular status: blood pressure returned to baseline and stable Postop Assessment: no apparent nausea or vomiting Anesthetic complications: no   No complications documented.   Last Vitals:  Vitals:   12/12/19 1206 12/12/19 1216  BP: 99/87 136/86  Pulse: 72 67  Resp: 18 15  Temp:    SpO2: 100% 100%    Last Pain:  Vitals:   12/12/19 1216  TempSrc:   PainSc: 0-No pain                 Brett Canales Ousman Dise

## 2019-12-16 ENCOUNTER — Encounter: Payer: Self-pay | Admitting: Internal Medicine

## 2020-04-23 DIAGNOSIS — L271 Localized skin eruption due to drugs and medicaments taken internally: Secondary | ICD-10-CM | POA: Diagnosis not present

## 2020-05-16 DIAGNOSIS — I10 Essential (primary) hypertension: Secondary | ICD-10-CM | POA: Diagnosis not present

## 2020-05-16 DIAGNOSIS — L509 Urticaria, unspecified: Secondary | ICD-10-CM | POA: Diagnosis not present

## 2020-06-04 DIAGNOSIS — I10 Essential (primary) hypertension: Secondary | ICD-10-CM | POA: Diagnosis not present

## 2020-06-04 DIAGNOSIS — L509 Urticaria, unspecified: Secondary | ICD-10-CM | POA: Diagnosis not present

## 2020-07-02 DIAGNOSIS — J305 Allergic rhinitis due to food: Secondary | ICD-10-CM | POA: Diagnosis not present

## 2020-07-02 DIAGNOSIS — L508 Other urticaria: Secondary | ICD-10-CM | POA: Diagnosis not present

## 2021-03-18 ENCOUNTER — Other Ambulatory Visit: Payer: Self-pay | Admitting: Family Medicine

## 2021-03-18 DIAGNOSIS — Z1231 Encounter for screening mammogram for malignant neoplasm of breast: Secondary | ICD-10-CM

## 2021-03-18 DIAGNOSIS — D649 Anemia, unspecified: Secondary | ICD-10-CM | POA: Diagnosis not present

## 2021-03-18 DIAGNOSIS — E782 Mixed hyperlipidemia: Secondary | ICD-10-CM | POA: Diagnosis not present

## 2021-03-18 DIAGNOSIS — R768 Other specified abnormal immunological findings in serum: Secondary | ICD-10-CM | POA: Diagnosis not present

## 2021-03-18 DIAGNOSIS — R700 Elevated erythrocyte sedimentation rate: Secondary | ICD-10-CM | POA: Diagnosis not present

## 2021-03-18 DIAGNOSIS — Z0000 Encounter for general adult medical examination without abnormal findings: Secondary | ICD-10-CM | POA: Diagnosis not present

## 2021-03-18 DIAGNOSIS — E538 Deficiency of other specified B group vitamins: Secondary | ICD-10-CM | POA: Diagnosis not present

## 2021-03-18 DIAGNOSIS — R7309 Other abnormal glucose: Secondary | ICD-10-CM | POA: Diagnosis not present

## 2021-03-18 DIAGNOSIS — I10 Essential (primary) hypertension: Secondary | ICD-10-CM | POA: Diagnosis not present

## 2021-04-05 ENCOUNTER — Ambulatory Visit
Admission: RE | Admit: 2021-04-05 | Discharge: 2021-04-05 | Disposition: A | Discharge status: Home / Self Care | Payer: PPO | Source: Ambulatory Visit | Attending: Family Medicine | Admitting: Family Medicine

## 2021-04-05 ENCOUNTER — Other Ambulatory Visit: Payer: Self-pay

## 2021-04-05 DIAGNOSIS — Z1231 Encounter for screening mammogram for malignant neoplasm of breast: Secondary | ICD-10-CM | POA: Insufficient documentation

## 2021-04-22 DIAGNOSIS — M8580 Other specified disorders of bone density and structure, unspecified site: Secondary | ICD-10-CM | POA: Diagnosis not present

## 2021-04-22 DIAGNOSIS — M3505 Sjogren syndrome with inflammatory arthritis: Secondary | ICD-10-CM | POA: Diagnosis not present

## 2021-04-22 DIAGNOSIS — Z7960 Long term (current) use of unspecified immunomodulators and immunosuppressants: Secondary | ICD-10-CM | POA: Diagnosis not present

## 2021-04-22 DIAGNOSIS — D709 Neutropenia, unspecified: Secondary | ICD-10-CM | POA: Diagnosis not present

## 2021-06-17 DIAGNOSIS — H524 Presbyopia: Secondary | ICD-10-CM | POA: Diagnosis not present

## 2021-06-17 DIAGNOSIS — Z79899 Other long term (current) drug therapy: Secondary | ICD-10-CM | POA: Diagnosis not present

## 2021-06-17 DIAGNOSIS — H353132 Nonexudative age-related macular degeneration, bilateral, intermediate dry stage: Secondary | ICD-10-CM | POA: Diagnosis not present

## 2021-06-17 DIAGNOSIS — Z961 Presence of intraocular lens: Secondary | ICD-10-CM | POA: Diagnosis not present

## 2021-06-17 DIAGNOSIS — H43813 Vitreous degeneration, bilateral: Secondary | ICD-10-CM | POA: Diagnosis not present

## 2021-08-26 DIAGNOSIS — M8580 Other specified disorders of bone density and structure, unspecified site: Secondary | ICD-10-CM | POA: Diagnosis not present

## 2021-08-26 DIAGNOSIS — M3505 Sjogren syndrome with inflammatory arthritis: Secondary | ICD-10-CM | POA: Diagnosis not present

## 2021-08-26 DIAGNOSIS — Z7960 Long term (current) use of unspecified immunomodulators and immunosuppressants: Secondary | ICD-10-CM | POA: Diagnosis not present

## 2021-11-11 DIAGNOSIS — M65341 Trigger finger, right ring finger: Secondary | ICD-10-CM | POA: Diagnosis not present

## 2021-11-11 DIAGNOSIS — M8580 Other specified disorders of bone density and structure, unspecified site: Secondary | ICD-10-CM | POA: Diagnosis not present

## 2021-11-11 DIAGNOSIS — Z7960 Long term (current) use of unspecified immunomodulators and immunosuppressants: Secondary | ICD-10-CM | POA: Diagnosis not present

## 2021-11-11 DIAGNOSIS — M3505 Sjogren syndrome with inflammatory arthritis: Secondary | ICD-10-CM | POA: Diagnosis not present

## 2022-03-17 DIAGNOSIS — M8580 Other specified disorders of bone density and structure, unspecified site: Secondary | ICD-10-CM | POA: Diagnosis not present

## 2022-03-17 DIAGNOSIS — M545 Low back pain, unspecified: Secondary | ICD-10-CM | POA: Diagnosis not present

## 2022-03-17 DIAGNOSIS — M3505 Sjogren syndrome with inflammatory arthritis: Secondary | ICD-10-CM | POA: Diagnosis not present

## 2022-03-17 DIAGNOSIS — Z7960 Long term (current) use of unspecified immunomodulators and immunosuppressants: Secondary | ICD-10-CM | POA: Diagnosis not present

## 2024-06-05 ENCOUNTER — Emergency Department

## 2024-06-05 ENCOUNTER — Emergency Department
Admission: EM | Admit: 2024-06-05 | Discharge: 2024-06-05 | Disposition: A | Discharge status: Home / Self Care | Attending: Emergency Medicine | Admitting: Emergency Medicine

## 2024-06-05 DIAGNOSIS — Y990 Civilian activity done for income or pay: Secondary | ICD-10-CM | POA: Diagnosis not present

## 2024-06-05 DIAGNOSIS — W01198A Fall on same level from slipping, tripping and stumbling with subsequent striking against other object, initial encounter: Secondary | ICD-10-CM | POA: Diagnosis not present

## 2024-06-05 DIAGNOSIS — I10 Essential (primary) hypertension: Secondary | ICD-10-CM | POA: Insufficient documentation

## 2024-06-05 DIAGNOSIS — W19XXXA Unspecified fall, initial encounter: Secondary | ICD-10-CM

## 2024-06-05 DIAGNOSIS — S0990XA Unspecified injury of head, initial encounter: Secondary | ICD-10-CM | POA: Insufficient documentation

## 2024-06-05 NOTE — Discharge Instructions (Addendum)
 The imaging of your head and neck was normal.  Your blood pressure was elevated while in the emergency department today.  This may be due to pain or anxiety around being in the ER.  Please follow-up with your primary care provider for blood pressure recheck.  Based on your symptoms today you may have a concussion. This is a clinical diagnosis which means there is no diagnostic test to confirm this. Symptoms of a concussion include: headache, dizziness, insomnia, fatigue, uneven gait, nausea, vomiting, blurred vision and difficulty concentrating. These symptoms may develop and change overtime. Most people have symptom resolution in 7-10 days but it may take up to two weeks to a month. If you are still having symptoms after 2 weeks please be evaluated by another healthcare provider. This could be your primary care provider, urgent care or the emergency department. You should take it easy for the 24-48 hours.  Avoid activities that require a lot of thinking or physical effort.  After a few days gradually return to normal activities as long as they do not make symptoms worse.  If your symptoms return or get worse, slow down and rest more.  Avoid activities that could lead to another head injury until cleared by a healthcare provider.  Do not drive, ride a bike or operate heavy machinery until you feel fully alert. Return to the ED if you have repeat vomiting, severe or worsening headache that does not go away with pain medication, trouble waking up, staying awake or unusual drowsiness, slurred speech or trouble speaking, weakness/numbness or trouble moving arms or legs, seizure, unequal pupil sizes or clear fluid or blood coming from the nose or ears.

## 2024-06-05 NOTE — ED Triage Notes (Signed)
 Pt presents to the ED via [employer] from work. Pt tripped and fell at work and fell onto her knees. Reports bilateral knee pain. Pt hit her head on the cabinet. No LOC. No hx of htn. No use of blood thinners. Pt was ambulatory after incident. Pt reports minimal knee pain  179/106

## 2024-06-05 NOTE — ED Provider Notes (Signed)
 "  [facility] Provider Note    Event Date/Time   First MD Initiated Contact with Patient 06/05/24 1525     (approximate)   History   Fall   HPI  Cassie Torres is a 73 y.o. female with PMH of HTN, GERD presents for evaluation of fall. Patient tripped over a cord at work causing her to fall forward and land on both knees. As she fell she hit her head on a cabinet. She did not lose consciousness, she has not had any vomiting, no visual changes.       Physical Exam   Triage Vital Signs: ED Triage Vitals  Encounter Vitals Group     BP 06/05/24 1459 (!) 194/93     Girls Systolic BP Percentile --      Girls Diastolic BP Percentile --      Boys Systolic BP Percentile --      Boys Diastolic BP Percentile --      Pulse Rate 06/05/24 1459 81     Resp 06/05/24 1459 18     Temp 06/05/24 1459 98.1 F (36.7 C)     Temp Source 06/05/24 1459 Oral     SpO2 06/05/24 1459 99 %     Weight 06/05/24 1500 210 lb (95.3 kg)     Height 06/05/24 1500 5' 7 (1.702 m)     Head Circumference --      Peak Flow --      Pain Score 06/05/24 1459 3     Pain Loc --      Pain Education --      Exclude from Growth Chart --     Most recent vital signs: Vitals:   06/05/24 1557 06/05/24 1732  BP:  (!) 180/107  Pulse:    Resp:    Temp:    SpO2: 99%    General: Awake, no distress.  CV:  Good peripheral perfusion.  RRR. Resp:  Normal effort.  CTAB. Abd:  No distention.  Other:  PERRL, EOM intact, no ataxia on finger-to-nose, no pronator drift, symmetric facial movement.  Mild tenderness to palpation suprapatellar on both knees, knee range of motion well-maintained   ED Results / Procedures / Treatments   Labs (all labs ordered are listed, but only abnormal results are displayed) Labs Reviewed - No data to display   RADIOLOGY  CT head and cervical spine obtained, interpreted the images as well as reviewed the radiologist report which was negative for acute  intracranial abnormalities as well as traumatic listhesis.   PROCEDURES:  Critical Care performed: No  Procedures   MEDICATIONS ORDERED IN ED: Medications - No data to display   IMPRESSION / MDM / ASSESSMENT AND PLAN / ED COURSE  I reviewed the triage vital signs and the nursing notes.                             73 year old female presents for evaluation of a fall. Patient is hypertensive, but VSS otherwise.   Differential diagnosis includes, but is not limited to, contusion, muscle strain, fracture, intracranial bleed, abrasion.  Patient's presentation is most consistent with acute complicated illness / injury requiring diagnostic workup.  She was hypertensive throughout the duration of her visit.  She states that her blood pressure has previously been well-controlled for several years and she has not been on medication.  She is asymptomatic.  Do not suspect hypertensive emergency.  Discussed  following up with her primary care for blood pressure recheck.  Patient had very mild tenderness to palpation over the knees.  She was able to fully range both knees and is able to bear weight without pain.  Very low suspicion for fracture so x-rays were not obtained.  Patient was in agreement with this.  Patient did hit her head on a cabinet while she fell.  Neuroexam was reassuring.  Discussed getting imaging with the patient to rule out intracranial bleed and skull fracture even though I have a low suspicion for this.  Patient did prefer to get imaging.  Images were negative.  Patient was advised on signs of concussion to watch for although I have a low suspicion for this given her low symptom burden.  Discussed symptomatic management of her pain.  Patient voiced understanding, all questions were answered and she stable at discharge.     FINAL CLINICAL IMPRESSION(S) / ED DIAGNOSES   Final diagnoses:  Fall, initial encounter  Injury of head, initial encounter     Rx / DC Orders   ED  Discharge Orders     None        Note:  This document was prepared using Dragon voice recognition software and may include unintentional dictation errors.   Cleaster Tinnie LABOR, PA-C 06/05/24 1743    Floy Roberts, MD 06/05/24 1831  "
